# Patient Record
Sex: Female | Born: 1965
Health system: Southern US, Community
[De-identification: ages and names within clinical notes are randomized; demographics above are authoritative.]

## PROBLEM LIST (undated history)

## (undated) DIAGNOSIS — G8929 Other chronic pain: Secondary | ICD-10-CM

## (undated) DIAGNOSIS — G4730 Sleep apnea, unspecified: Secondary | ICD-10-CM

## (undated) DIAGNOSIS — K219 Gastro-esophageal reflux disease without esophagitis: Secondary | ICD-10-CM

## (undated) HISTORY — PX: ABDOMINAL HYSTERECTOMY: SHX81

## (undated) HISTORY — DX: Gastro-esophageal reflux disease without esophagitis: K21.9

## (undated) HISTORY — DX: Other chronic pain: G89.29

## (undated) HISTORY — PX: FRACTURE SURGERY: SHX138

## (undated) HISTORY — PX: FOOT SURGERY: SHX648

## (undated) HISTORY — DX: Sleep apnea, unspecified: G47.30

---

## 2013-01-31 ENCOUNTER — Emergency Department (HOSPITAL_COMMUNITY)
Admission: EM | Admit: 2013-01-31 | Discharge: 2013-01-31 | Disposition: A | Discharge status: Home / Self Care | Payer: BC Managed Care – PPO | Attending: Emergency Medicine | Admitting: Emergency Medicine

## 2013-01-31 ENCOUNTER — Encounter (HOSPITAL_COMMUNITY): Payer: Self-pay | Admitting: Emergency Medicine

## 2013-01-31 ENCOUNTER — Emergency Department (HOSPITAL_COMMUNITY): Payer: BC Managed Care – PPO

## 2013-01-31 DIAGNOSIS — R079 Chest pain, unspecified: Secondary | ICD-10-CM | POA: Insufficient documentation

## 2013-01-31 DIAGNOSIS — R000 Tachycardia, unspecified: Secondary | ICD-10-CM | POA: Insufficient documentation

## 2013-01-31 LAB — URINALYSIS, ROUTINE W REFLEX MICROSCOPIC
Bilirubin Urine: NEGATIVE
Ketones, ur: NEGATIVE mg/dL
Leukocytes, UA: NEGATIVE
Nitrite: NEGATIVE
Protein, ur: NEGATIVE mg/dL
Urobilinogen, UA: 0.2 mg/dL (ref 0.0–1.0)
pH: 6.5 (ref 5.0–8.0)

## 2013-01-31 LAB — COMPREHENSIVE METABOLIC PANEL
AST: 19 U/L (ref 0–37)
Albumin: 4.2 g/dL (ref 3.5–5.2)
Alkaline Phosphatase: 94 U/L (ref 39–117)
BUN: 10 mg/dL (ref 6–23)
CO2: 24 mEq/L (ref 19–32)
Chloride: 96 mEq/L (ref 96–112)
Creatinine, Ser: 0.88 mg/dL (ref 0.50–1.10)
GFR calc Af Amer: 89 mL/min — ABNORMAL LOW (ref >90)
Glucose, Bld: 111 mg/dL — ABNORMAL HIGH (ref 70–99)
Potassium: 3.2 mEq/L — ABNORMAL LOW (ref 3.5–5.1)
Total Bilirubin: 0.3 mg/dL (ref 0.3–1.2)
Total Protein: 7.6 g/dL (ref 6.0–8.3)

## 2013-01-31 LAB — CBC
HCT: 39.1 % (ref 36.0–46.0)
MCH: 30.4 pg (ref 26.0–34.0)
MCHC: 33.2 g/dL (ref 30.0–36.0)
Platelets: 288 10*3/uL (ref 150–400)
RBC: 4.27 MIL/uL (ref 3.87–5.11)
RDW: 12.6 % (ref 11.5–15.5)
WBC: 8.7 10*3/uL (ref 4.0–10.5)

## 2013-01-31 LAB — POCT I-STAT TROPONIN I: Troponin i, poc: 0 ng/mL (ref 0.00–0.08)

## 2013-01-31 LAB — PROTIME-INR
INR: 0.93 (ref 0.00–1.49)
Prothrombin Time: 12.3 seconds (ref 11.6–15.2)

## 2013-01-31 LAB — PRO B NATRIURETIC PEPTIDE: Pro B Natriuretic peptide (BNP): 24.9 pg/mL (ref 0–125)

## 2013-01-31 MED ORDER — ASPIRIN 81 MG PO CHEW
324.0000 mg | CHEWABLE_TABLET | Freq: Once | ORAL | Status: AC
  Administered 2013-01-31: 324 mg via ORAL
  Filled 2013-01-31: qty 4

## 2013-01-31 MED ORDER — IOHEXOL 350 MG/ML SOLN
100.0000 mL | Freq: Once | INTRAVENOUS | Status: AC | PRN
  Administered 2013-01-31: 100 mL via INTRAVENOUS

## 2013-01-31 MED ORDER — SODIUM CHLORIDE 0.9 % IV SOLN
Freq: Once | INTRAVENOUS | Status: AC
  Administered 2013-01-31: 22:00:00 via INTRAVENOUS

## 2013-01-31 NOTE — ED Provider Notes (Signed)
CSN: [id number]     Arrival date & time 01/31/13  1838 History   First MD Initiated Contact with Patient 01/31/13 1846     Chief Complaint  Patient presents with  . Chest Pain   (Consider location/radiation/quality/duration/timing/severity/associated sxs/prior Treatment) HPI Patient presents with chest discomfort. Symptoms began several hours ago, insidiously.  Initially the patient had no discomfort in her neck, then tightness in her intrascapular area, and subsequently developed chest discomfort.  The chest discomfort is focally about the left sternal area, with mild radiation towards his left shoulder.  There is no nausea, no vomiting.  There is mild unsettled sensation, mild dyspnea. No concurrent extremity edema, dysesthesia, any syncope, confusion, disorientation. Patient has no recent travel history, does not smoke, does not drink substantially. No notable family history.  History reviewed. No pertinent past medical history. Past Surgical History  Procedure Laterality Date  . Abdominal hysterectomy     No family history on file. History  Substance Use Topics  . Smoking status: Never Smoker   . Smokeless tobacco: Not on file  . Alcohol Use: No   OB History   Grav Para Term Preterm Abortions TAB SAB Ect Mult Living                 Review of Systems  Constitutional:       Per HPI, otherwise negative  HENT:       Per HPI, otherwise negative  Respiratory:       Per HPI, otherwise negative  Cardiovascular:       Per HPI, otherwise negative  Gastrointestinal: Negative for vomiting.  Endocrine:       Negative aside from HPI  Genitourinary:       Neg aside from HPI   Musculoskeletal:       Per HPI, otherwise negative  Skin: Negative.   Neurological: Negative for syncope.    Allergies  Review of patient's allergies indicates not on file.  Home Medications  No current outpatient prescriptions on file. BP 165/86  Pulse 106  Temp(Src) 97.9 F (36.6 C) (Oral)   Resp 14  SpO2 100% Physical Exam  Nursing note and vitals reviewed. Constitutional: She is oriented to person, place, and time. She appears well-developed and well-nourished. No distress.  HENT:  Head: Normocephalic and atraumatic.  Eyes: Conjunctivae and EOM are normal.  Cardiovascular: Regular rhythm.  Tachycardia present.   Pulses are symmetric in all 4 extremes  Pulmonary/Chest: Effort normal and breath sounds normal. No stridor. No respiratory distress.  Abdominal: She exhibits no distension.  Musculoskeletal: She exhibits no edema.  Neurological: She is alert and oriented to person, place, and time. No cranial nerve deficit.  Skin: Skin is warm and dry.  Psychiatric: She has a normal mood and affect.    ED Course  Procedures (including critical care time) Labs Review Labs Reviewed  PRO B NATRIURETIC PEPTIDE  CBC  COMPREHENSIVE METABOLIC PANEL  PROTIME-INR  D-DIMER, QUANTITATIVE   Imaging Review No results found.  EKG Interpretation     Ventricular Rate:  103 PR Interval:  143 QRS Duration: 103 QT Interval:  341 QTC Calculation: 446 R Axis:   3 Text Interpretation:  Sinus tachycardia Anterior infarct, age indeterminate Baseline wander in lead(s) V1 V2 Long QT Abnormal ekg           Cardiac 110 abnormal Pulse oximetry 100% room air normal  9:19 PM Patient CP free.  She now recalls a prior stress test that was largely unremarkable save  for MVP. We discussed admission - she prefers D/C.  We agreed to perform serial troponins, and reassess MDM   1. Chest pain    this patient presents after day of discomfort, but specifically no pain in her chest, back, with occasional dyspnea.  On initial exam the patient is tachycardic, and given her description of pain in her back, chest, mild dyspnea, PE and aortic dissection were considerations.  The patient improved substantially here, her reassuring labs, x-ray, CT scan.  I advised the patient for overnight  evaluation.,  But she preferred to discharge.  Subsequently we had serial troponins performed, which were negative, reassuring.  I had a lengthy discussion with the patient and family members about the need for ongoing evaluation as an outpatient, and return precautions.     Gerhard Munch, MD 02/01/13 [phone]

## 2013-01-31 NOTE — ED Notes (Signed)
Per pt, felt "off" earlier today-states a few minutes ago felt tightness in back-felt neck tight and jaw pain-discomfort in left chest radiating to left arm-dizzy, no N/V

## 2013-01-31 NOTE — ED Notes (Signed)
MD at bedside speaking to pt and family at this time

## 2013-01-31 NOTE — Progress Notes (Signed)
Patient confirms she doesnot have a pcp.  Patient and her family moved to [facility] from [place] three years ago.  Patient believes her pcp will be at [facility].  She has not made an appointment yet.

## 2013-02-21 ENCOUNTER — Encounter: Payer: Self-pay | Admitting: Family

## 2013-02-21 ENCOUNTER — Ambulatory Visit (INDEPENDENT_AMBULATORY_CARE_PROVIDER_SITE_OTHER): Payer: BC Managed Care – PPO | Admitting: Family

## 2013-02-21 VITALS — BP 120/80 | HR 80 | Ht 66.0 in | Wt 220.0 lb

## 2013-02-21 DIAGNOSIS — K219 Gastro-esophageal reflux disease without esophagitis: Secondary | ICD-10-CM | POA: Insufficient documentation

## 2013-02-21 DIAGNOSIS — Z0000 Encounter for general adult medical examination without abnormal findings: Secondary | ICD-10-CM

## 2013-02-21 DIAGNOSIS — Z1231 Encounter for screening mammogram for malignant neoplasm of breast: Secondary | ICD-10-CM

## 2013-02-21 NOTE — Patient Instructions (Signed)

## 2013-02-21 NOTE — Progress Notes (Signed)
   Subjective:    Patient ID: Carmen Knight, female    DOB: 1965/11/02, 47 y.o.   MRN: [id number]  HPI  47 year old white female, nonsmoker, the patient to the practice and to be established. She was seen in the emergency department on 01/31/2013 after she presented with chest pain and tightness. She had a full cardiac workup to include cardiac enzymes are all negative. She was found to have GERD. Patient reports taking Prilosec x10 days and her symptoms completely resolved. She denies any current chest pain, palpitations, shortness of breath, or edema. Has not seen a primary care provider in approximately 2 years.  Review of Systems  Constitutional: Negative.   HENT: Negative.   Respiratory: Negative.   Cardiovascular: Negative.   Gastrointestinal: Negative.   Endocrine: Negative.   Genitourinary: Negative.   Musculoskeletal: Negative.   Skin: Negative.   Allergic/Immunologic: Negative.   Neurological: Negative.   Hematological: Negative.   Psychiatric/Behavioral: Negative.    No past medical history on file.  History   Social History  . Marital Status: Married    Spouse Name: N/A    Number of Children: N/A  . Years of Education: N/A   Occupational History  . Not on file.   Social History Main Topics  . Smoking status: Never Smoker   . Smokeless tobacco: Not on file  . Alcohol Use: No  . Drug Use: No  . Sexual Activity: Not on file   Other Topics Concern  . Not on file   Social History Narrative  . No narrative on file    Past Surgical History  Procedure Laterality Date  . Abdominal hysterectomy    . Foot surgery      Family History  Problem Relation Age of Onset  . Prostate cancer Father     No Known Allergies  No current outpatient prescriptions on file prior to visit.   No current facility-administered medications on file prior to visit.    BP 120/80  Pulse 80  Ht 5\' 6"  (1.676 m)  Wt 220 lb (99.791 kg)  BMI 35.53 kg/m2chart     Objective:   Physical Exam  Constitutional: She is oriented to person, place, and time. She appears well-developed and well-nourished.  HENT:  Head: Normocephalic.  Right Ear: External ear normal.  Left Ear: External ear normal.  Nose: Nose normal.  Mouth/Throat: Oropharynx is clear and moist.  Neck: Normal range of motion. Neck supple.  Cardiovascular: Normal rate, regular rhythm and normal heart sounds.   Pulmonary/Chest: Effort normal and breath sounds normal.  Abdominal: Bowel sounds are normal.  Musculoskeletal: Normal range of motion.  Neurological: She is alert and oriented to person, place, and time.  Skin: Skin is warm and dry.  Psychiatric: She has a normal mood and affect.          Assessment & Plan:  Assessment: 1. GERD 2. Obesity  Plan: Continue Prilosec OTC as needed. Fasting labs for her physical OB obtained today to include CMP, lipid, CBC, TSH, UA we'll notify patient pending results. Encouraged healthy diet, exercise, self breast exams. Follow up for physical in 3 weeks and sooner as needed.

## 2013-02-21 NOTE — Progress Notes (Signed)
Pre visit review using our clinic review tool, if applicable. No additional management support is needed unless otherwise documented below in the visit note. 

## 2013-04-02 ENCOUNTER — Encounter: Payer: Self-pay | Admitting: Family

## 2013-04-02 ENCOUNTER — Other Ambulatory Visit (HOSPITAL_COMMUNITY)
Admission: RE | Admit: 2013-04-02 | Discharge: 2013-04-02 | Disposition: A | Discharge status: Home / Self Care | Payer: BC Managed Care – PPO | Source: Ambulatory Visit | Attending: Family | Admitting: Family

## 2013-04-02 ENCOUNTER — Ambulatory Visit (INDEPENDENT_AMBULATORY_CARE_PROVIDER_SITE_OTHER): Payer: BC Managed Care – PPO | Admitting: Family

## 2013-04-02 VITALS — BP 120/90 | HR 86 | Ht 66.0 in | Wt 218.0 lb

## 2013-04-02 DIAGNOSIS — E669 Obesity, unspecified: Secondary | ICD-10-CM | POA: Insufficient documentation

## 2013-04-02 DIAGNOSIS — Z0000 Encounter for general adult medical examination without abnormal findings: Secondary | ICD-10-CM

## 2013-04-02 DIAGNOSIS — Z124 Encounter for screening for malignant neoplasm of cervix: Secondary | ICD-10-CM

## 2013-04-02 DIAGNOSIS — Z01419 Encounter for gynecological examination (general) (routine) without abnormal findings: Secondary | ICD-10-CM | POA: Insufficient documentation

## 2013-04-02 DIAGNOSIS — E66812 Obesity, class 2: Secondary | ICD-10-CM | POA: Insufficient documentation

## 2013-04-02 LAB — CBC WITH DIFFERENTIAL/PLATELET
BASOS ABS: 0 10*3/uL (ref 0.0–0.1)
Basophils Relative: 0.5 % (ref 0.0–3.0)
Eosinophils Absolute: 0.2 10*3/uL (ref 0.0–0.7)
Eosinophils Relative: 3.7 % (ref 0.0–5.0)
HEMATOCRIT: 38.1 % (ref 36.0–46.0)
Hemoglobin: 12.8 g/dL (ref 12.0–15.0)
LYMPHS ABS: 1.6 10*3/uL (ref 0.7–4.0)
Lymphocytes Relative: 32.8 % (ref 12.0–46.0)
MCHC: 33.6 g/dL (ref 30.0–36.0)
MCV: 91.5 fl (ref 78.0–100.0)
MONO ABS: 0.4 10*3/uL (ref 0.1–1.0)
Monocytes Relative: 8.4 % (ref 3.0–12.0)
Neutro Abs: 2.6 10*3/uL (ref 1.4–7.7)
Neutrophils Relative %: 54.6 % (ref 43.0–77.0)
Platelets: 308 10*3/uL (ref 150.0–400.0)
RBC: 4.16 Mil/uL (ref 3.87–5.11)
RDW: 13.8 % (ref 11.5–14.6)
WBC: 4.8 10*3/uL (ref 4.5–10.5)

## 2013-04-02 LAB — LIPID PANEL
CHOLESTEROL: 183 mg/dL (ref 0–200)
HDL: 39 mg/dL — AB (ref >39.00)
LDL Cholesterol: 115 mg/dL — ABNORMAL HIGH (ref 0–99)
TRIGLYCERIDES: 145 mg/dL (ref 0.0–149.0)
Total CHOL/HDL Ratio: 5
VLDL: 29 mg/dL (ref 0.0–40.0)

## 2013-04-02 LAB — POCT URINALYSIS DIPSTICK
BILIRUBIN UA: NEGATIVE
Glucose, UA: NEGATIVE
Ketones, UA: NEGATIVE
Leukocytes, UA: NEGATIVE
Nitrite, UA: NEGATIVE
PH UA: 5
Protein, UA: NEGATIVE
RBC UA: NEGATIVE
Spec Grav, UA: >=1.03
UROBILINOGEN UA: 0.2

## 2013-04-02 LAB — COMPREHENSIVE METABOLIC PANEL
ALBUMIN: 4.1 g/dL (ref 3.5–5.2)
ALK PHOS: 73 U/L (ref 39–117)
ALT: 17 U/L (ref 0–35)
AST: 17 U/L (ref 0–37)
BUN: 13 mg/dL (ref 6–23)
CO2: 28 mEq/L (ref 19–32)
Calcium: 9.2 mg/dL (ref 8.4–10.5)
Chloride: 104 mEq/L (ref 96–112)
Creatinine, Ser: 0.8 mg/dL (ref 0.4–1.2)
GFR: 83.65 mL/min (ref >60.00)
GLUCOSE: 104 mg/dL — AB (ref 70–99)
POTASSIUM: 4.1 meq/L (ref 3.5–5.1)
SODIUM: 140 meq/L (ref 135–145)
Total Bilirubin: 0.7 mg/dL (ref 0.3–1.2)
Total Protein: 7.2 g/dL (ref 6.0–8.3)

## 2013-04-02 LAB — TSH: TSH: 3.3 u[IU]/mL (ref 0.35–5.50)

## 2013-04-02 NOTE — Progress Notes (Signed)
   Subjective:    Patient ID: Carmen PancoastKaren Knight, female    DOB: 1965-06-21, 48 y.o.   MRN: [id number]  HPI  48 year old caucasian female, nonsmoker presenting today for an annual complete physical examination.  This is a routine physical examination for this healthy  female. Reviewed all health maintenance protocols including mammogram and reviewed appropriate screening labs. Her immunization history was reviewed.    Review of Systems  Constitutional: Negative.   HENT: Negative.   Eyes: Negative.   Respiratory: Negative.   Cardiovascular: Negative.   Gastrointestinal: Negative.   Endocrine: Negative.   Genitourinary: Negative.   Musculoskeletal: Negative.   Skin: Negative.   Allergic/Immunologic: Negative.   Neurological: Negative.   Hematological: Negative.   Psychiatric/Behavioral: Negative.    No past medical history on file.  History   Social History  . Marital Status: Married    Spouse Name: N/A    Number of Children: N/A  . Years of Education: N/A   Occupational History  . Not on file.   Social History Main Topics  . Smoking status: Never Smoker   . Smokeless tobacco: Not on file  . Alcohol Use: No  . Drug Use: No  . Sexual Activity: Not on file   Other Topics Concern  . Not on file   Social History Narrative  . No narrative on file    Past Surgical History  Procedure Laterality Date  . Abdominal hysterectomy    . Foot surgery      Family History  Problem Relation Age of Onset  . Prostate cancer Father     No Known Allergies  No current outpatient prescriptions on file prior to visit.   No current facility-administered medications on file prior to visit.    BP 120/90  Pulse 86  Ht 5\' 6"  (1.676 m)  Wt 218 lb (98.884 kg)  BMI 35.20 kg/m2chart    Objective:   Physical Exam  Constitutional: She is oriented to person, place, and time. She appears well-developed and well-nourished.  HENT:  Head: Normocephalic and atraumatic.  Right Ear:  External ear normal.  Left Ear: External ear normal.  Eyes: Conjunctivae and EOM are normal. Pupils are equal, round, and reactive to light.  Neck: Normal range of motion. Neck supple.  Cardiovascular: Normal rate, regular rhythm and normal heart sounds.   Pulmonary/Chest: Effort normal and breath sounds normal. Right breast exhibits no inverted nipple, no mass, no nipple discharge, no skin change and no tenderness. Left breast exhibits no inverted nipple, no mass, no nipple discharge, no skin change and no tenderness. Breasts are symmetrical.  Abdominal: Soft. Bowel sounds are normal.  Genitourinary: Vagina normal and uterus normal. Guaiac negative stool. No vaginal discharge found.  Musculoskeletal: Normal range of motion.  Neurological: She is alert and oriented to person, place, and time.  Skin: Skin is warm and dry.  Psychiatric: She has a normal mood and affect.          Assessment & Plan:  Assessment 1. Annual Physical  Examination 2. Obesity  Plan 1. Scheduled for mammogram. 2. Obtain labs that include CMP, Lipid, CBC, TSH, and UA.  3.  Encourage self breast exams monthly. 4.Call office for questions or concerns.

## 2013-04-02 NOTE — Patient Instructions (Signed)

## 2014-07-12 ENCOUNTER — Encounter: Payer: Self-pay | Admitting: Family Medicine

## 2014-07-12 ENCOUNTER — Ambulatory Visit (INDEPENDENT_AMBULATORY_CARE_PROVIDER_SITE_OTHER): Payer: 59 | Admitting: Family Medicine

## 2014-07-12 VITALS — BP 104/70 | HR 84 | Temp 98.7°F | Ht 66.0 in | Wt 221.3 lb

## 2014-07-12 DIAGNOSIS — L853 Xerosis cutis: Secondary | ICD-10-CM | POA: Diagnosis not present

## 2014-07-12 DIAGNOSIS — S39011A Strain of muscle, fascia and tendon of abdomen, initial encounter: Secondary | ICD-10-CM | POA: Diagnosis not present

## 2014-07-12 DIAGNOSIS — R682 Dry mouth, unspecified: Secondary | ICD-10-CM

## 2014-07-12 LAB — TSH: TSH: 3.05 u[IU]/mL (ref 0.35–4.50)

## 2014-07-12 LAB — HEMOGLOBIN A1C: HEMOGLOBIN A1C: 5.7 % (ref 4.6–6.5)

## 2014-07-12 NOTE — Progress Notes (Signed)
  HPI:  Dry mouth/dry skin: -started 2 weeks ago -no vision changes, polyuria, polydipsia, dry eyes, dry noses, sinus issues -she is going through menopause, hot flashes and dry skin -she wonders if is diabetes  R side pain: -plays tennis and thinks strained sometimes -just noticed it this morning when reached down in the shower -denies: fevers, malaise, nausea, diarrhea  ROS: See pertinent positives and negatives per HPI.  No past medical history on file.  Past Surgical History  Procedure Laterality Date  . Abdominal hysterectomy    . Foot surgery      Family History  Problem Relation Age of Onset  . Prostate cancer Father     History   Social History  . Marital Status: Married    Spouse Name: N/A  . Number of Children: N/A  . Years of Education: N/A   Social History Main Topics  . Smoking status: Never Smoker   . Smokeless tobacco: Not on file  . Alcohol Use: No  . Drug Use: No  . Sexual Activity: Not on file   Other Topics Concern  . None   Social History Narrative     Current outpatient prescriptions:  .  fexofenadine (ALLEGRA) 180 MG tablet, Take 180 mg by mouth as needed for allergies or rhinitis., Disp: , Rfl:   EXAM:  Filed Vitals:   07/12/14 1410  BP: 104/70  Pulse: 84  Temp: 98.7 F (37.1 C)    Body mass index is 35.74 kg/(m^2).  GENERAL: vitals reviewed and listed above, alert, oriented, appears well hydrated and in no acute distress  HEENT: atraumatic, conjunttiva clear, no obvious abnormalities on inspection of external nose and ears, normal exam of the oropharynx with normal amounts of saliva and no signs of dry mouth, eyes or nose  NECK: no obvious masses on inspection  LUNGS: clear to auscultation bilaterally, no wheezes, rales or rhonchi, good air movement  CV: HRRR, no peripheral edema  MS: moves all extremities without noticeable abnormality  Minimal TTP abd wal muscles R flank with superficial palpation, not TTP with  deeper structures, no rebound or guarding  PSYCH: pleasant and cooperative, no obvious depression or anxiety  ASSESSMENT AND PLAN:  Discussed the following assessment and plan:  Dry mouth - Plan: Hemoglobin A1c, TSH  Abdominal wall strain, initial encounter  Xerosis of skin  -tests per order for symptoms -lemon drops -follow up with dentist if labs normal and dry mouth continues -for strain heat and OTC analgesics and follow up if persists in 2-3 weeks -Patient advised to return or notify a doctor immediately if symptoms worsen or persist or new concerns arise.  Patient Instructions  BEFORE YOU LEAVE: -labs -schedule a new patient visit in 3 months  We recommend the following healthy lifestyle measures: - eat a healthy diet consisting of lots of vegetables, fruits, beans, nuts, seeds, healthy meats such as white chicken and fish and whole grains.  - avoid fried foods, fast food, processed foods, sodas, red meet and other fattening foods.  - get a least 150 minutes of aerobic exercise per week.       Kriste BasqueKIM, HANNAH R.

## 2014-07-12 NOTE — Patient Instructions (Signed)
BEFORE YOU LEAVE: -labs -schedule a new patient visit in 3 months  We recommend the following healthy lifestyle measures: - eat a healthy diet consisting of lots of vegetables, fruits, beans, nuts, seeds, healthy meats such as white chicken and fish and whole grains.  - avoid fried foods, fast food, processed foods, sodas, red meet and other fattening foods.  - get a least 150 minutes of aerobic exercise per week.

## 2014-07-12 NOTE — Progress Notes (Signed)
Pre visit review using our clinic review tool, if applicable. No additional management support is needed unless otherwise documented below in the visit note. 

## 2014-10-22 ENCOUNTER — Ambulatory Visit: Payer: 59 | Admitting: Family Medicine

## 2015-04-24 ENCOUNTER — Telehealth: Payer: Self-pay | Admitting: Family

## 2015-04-24 NOTE — Telephone Encounter (Signed)
Ok. Please schedule npv. Thanks.

## 2015-04-24 NOTE — Telephone Encounter (Signed)
Pt had an appt with dr Selena Batten on 10-22-14 and cancelled. Pt would like to re-est w/kim. Pt was seeing webb. Can I sch?

## 2015-04-25 ENCOUNTER — Encounter: Payer: Self-pay | Admitting: Family Medicine

## 2015-04-25 ENCOUNTER — Ambulatory Visit (INDEPENDENT_AMBULATORY_CARE_PROVIDER_SITE_OTHER): Payer: 59 | Admitting: Family Medicine

## 2015-04-25 VITALS — BP 126/80 | HR 82 | Temp 98.6°F | Ht 66.0 in | Wt 215.1 lb

## 2015-04-25 DIAGNOSIS — Z7189 Other specified counseling: Secondary | ICD-10-CM | POA: Diagnosis not present

## 2015-04-25 DIAGNOSIS — E669 Obesity, unspecified: Secondary | ICD-10-CM

## 2015-04-25 DIAGNOSIS — G479 Sleep disorder, unspecified: Secondary | ICD-10-CM

## 2015-04-25 DIAGNOSIS — F430 Acute stress reaction: Secondary | ICD-10-CM

## 2015-04-25 DIAGNOSIS — Z113 Encounter for screening for infections with a predominantly sexual mode of transmission: Secondary | ICD-10-CM | POA: Diagnosis not present

## 2015-04-25 DIAGNOSIS — Z7689 Persons encountering health services in other specified circumstances: Secondary | ICD-10-CM

## 2015-04-25 DIAGNOSIS — R0683 Snoring: Secondary | ICD-10-CM

## 2015-04-25 NOTE — Patient Instructions (Signed)
BEFORE YOU LEAVE: -labs -schedule physical in 3-5 months - come fasting and we will plan to do labs that day, drink plenty of water  We recommend the following healthy lifestyle measures: - eat a healthy whole foods diet consisting of regular small meals composed of vegetables, fruits, beans, nuts, seeds, healthy meats such as white chicken and fish and whole grains.  - avoid sweets, white starchy foods, fried foods, fast food, processed foods, sodas, red meet and other fattening foods.  - get a least 150-300 minutes of aerobic exercise per week.   -We have ordered labs or studies at this visit. It can take up to 1-2 weeks for results and processing. We will contact you with instructions IF your results are abnormal. Normal results will be released to your [facility]. If you have not heard from [place] or can not find your results in [facility] in 2 weeks please contact our office.  Call to schedule mammogram  Consider cologuard or colonoscopy and let us know which you would like to do  We placed a referral for you as discussed for evaluation for sleep apnea. It usually takes about 1-2 weeks to process and schedule this referral. If you have not heard from [place] regarding this appointment in 2 weeks please contact our office.

## 2015-04-25 NOTE — Progress Notes (Signed)
Pre visit review using our clinic review tool, if applicable. No additional management support is needed unless otherwise documented below in the visit note. 

## 2015-04-25 NOTE — Telephone Encounter (Signed)
Pt has been sch for today 

## 2015-04-25 NOTE — Progress Notes (Signed)
HPI:  Carmen Knight is a pleasant 50 yo F here to establish care. She used to see Worthy Rancher whom left this practice. She has PMH of allergies and takes allegra and GERD. She is s/p complete abdominal hysterectomy for menorrhagia - no cancer. She has unfortunately suffered mild depression and anxiety related to marital issues since 08/2014. She is getting CBT and is in a christian divorce support group and feels is coping well. Denies SI, or feeling unsafe. Wants STI testing as spouse cheated on her. Denies any symptoms of STIs. She wants testing for OSA as brther and multiple family members have this, she snores, wakes frequently of fit bit app and has unrestful sleep and occ mild daytime somnelence. She She is due for a number of preventive care measures including colon ca screening, mammogram and flu and tetanus vaccines.   ROS negative for unless reported above: fevers, unintentional weight loss, hearing or vision loss, chest pain, palpitations, struggling to breath, hemoptysis, melena, hematochezia, hematuria, falls, loc, si, thoughts of self harm  No past medical history on file.  Past Surgical History  Procedure Laterality Date  . Abdominal hysterectomy    . Foot surgery      Family History  Problem Relation Age of Onset  . Prostate cancer Father     Social History   Social History  . Marital Status: Married    Spouse Name: N/A  . Number of Children: N/A  . Years of Education: N/A   Social History Main Topics  . Smoking status: Never Smoker   . Smokeless tobacco: None  . Alcohol Use: No  . Drug Use: No  . Sexual Activity: Not Asked   Other Topics Concern  . None   Social History Narrative     Current outpatient prescriptions:  .  fexofenadine (ALLEGRA) 180 MG tablet, Take 180 mg by mouth as needed for allergies or rhinitis., Disp: , Rfl:   EXAM:  Filed Vitals:   04/25/15 1320  BP: 126/80  Pulse: 82  Temp: 98.6 F (37 C)    Body mass index is 34.73  kg/(m^2).  GENERAL: vitals reviewed and listed above, alert, oriented, appears well hydrated and in no acute distress  HEENT: atraumatic, conjunttiva clear, no obvious abnormalities on inspection of external nose and ears  NECK: no obvious masses on inspection  LUNGS: clear to auscultation bilaterally, no wheezes, rales or rhonchi, good air movement  CV: HRRR, no peripheral edema  MS: moves all extremities without noticeable abnormality  PSYCH: pleasant and cooperative, no obvious depression or anxiety  ASSESSMENT AND PLAN:  Discussed the following assessment and plan:  Routine screening for STI (sexually transmitted infection) - Plan: HIV antibody (with reflex), RPR  Establishing care with new doctor, encounter for -We reviewed the PMH, PSH, FH, SH, Meds and Allergies. -We provided refills for any medications we will prescribe as needed. -We addressed current concerns per orders and patient instructions. -We have asked for records for pertinent exams, studies, vaccines and notes from previous providers. -We have advised patient to follow up per instructions below.  Acute reaction to stress -supported, counseled, discussed tx options, she opted to cont CBT and support group, follow up if any worsening or new concerns  Obesity, unspecified - Plan: Ambulatory referral to Pulmonology Snoring - Plan: Ambulatory referral to Pulmonology Sleep disturbance - Plan: Ambulatory referral to Pulmonology -discussed testing and tx options -referral for evaluation for OSA -weight loss, healthy diet, regular exercise advised   -Patient advised  to return or notify a doctor immediately if symptoms worsen or persist or new concerns arise.  Patient Instructions  BEFORE YOU LEAVE: -labs -schedule physical in 3-5 months - come fasting and we will plan to do labs that day, drink plenty of water  We recommend the following healthy lifestyle measures: - eat a healthy whole foods diet consisting  of regular small meals composed of vegetables, fruits, beans, nuts, seeds, healthy meats such as white chicken and fish and whole grains.  - avoid sweets, white starchy foods, fried foods, fast food, processed foods, sodas, red meet and other fattening foods.  - get a least 150-300 minutes of aerobic exercise per week.   -We have ordered labs or studies at this visit. It can take up to 1-2 weeks for results and processing. We will contact you with instructions IF your results are abnormal. Normal results will be released to your [facility]. If you have not heard from [place] or can not find your results in [facility] in 2 weeks please contact our office.  Call to schedule mammogram  Consider cologuard or colonoscopy and let us know which you would like to do  We placed a referral for you as discussed for evaluation for sleep apnea. It usually takes about 1-2 weeks to process and schedule this referral. If you have not heard from [place] regarding this appointment in 2 weeks please contact our office.           [name] R.

## 2015-04-26 LAB — HIV ANTIBODY (ROUTINE TESTING W REFLEX): HIV 1&2 Ab, 4th Generation: NONREACTIVE

## 2015-04-26 LAB — RPR

## 2015-05-23 ENCOUNTER — Ambulatory Visit (INDEPENDENT_AMBULATORY_CARE_PROVIDER_SITE_OTHER): Payer: 59 | Admitting: Family Medicine

## 2015-05-23 ENCOUNTER — Encounter: Payer: Self-pay | Admitting: Family Medicine

## 2015-05-23 VITALS — BP 98/70 | HR 79 | Temp 97.6°F | Ht 65.75 in | Wt 212.7 lb

## 2015-05-23 DIAGNOSIS — Z0000 Encounter for general adult medical examination without abnormal findings: Secondary | ICD-10-CM | POA: Diagnosis not present

## 2015-05-23 DIAGNOSIS — B079 Viral wart, unspecified: Secondary | ICD-10-CM

## 2015-05-23 NOTE — Patient Instructions (Signed)
Before you leave: -Schedule a fasting lab visit in the next 4 weeks -Follow up yearly and as needed  Please call today to schedule your mammogram.   Please call your insurance company today to check on the cost of the cologuard, then let us know if you would like to do this  Vitamin D3 [phone]  international units daily.   -We have ordered labs or studies at this visit. It can take up to 1-2 weeks for results and processing. We will contact you with instructions IF your results are abnormal. Normal results will be released to your [facility]. If you have not heard from us or can not find your [facility] in 2 weeks please contact our office.  We recommend the following healthy lifestyle measures: - eat a healthy whole foods diet consisting of regular small meals composed of vegetables, fruits, beans, nuts, seeds, healthy meats such as white chicken and fish and whole grains.  - avoid sweets, white starchy foods, fried foods, fast food, processed foods, sodas, red meet and other fattening foods.  - get a least 150-300 minutes of aerobic exercise per week.

## 2015-05-23 NOTE — Progress Notes (Signed)
Pre visit review using our clinic review tool, if applicable. No additional management support is needed unless otherwise documented below in the visit note. 

## 2015-05-23 NOTE — Progress Notes (Signed)
HPI:   Carmen Knight is a very pleasant 50 year old with past medical history significant for allergies, acid reflux, status post complete abdominal hysterectomy, recent stress (seeing a therapist) and obesity here for a complete physical exam. She reports she is doing quite well. She has started a whole 30s diet and has been happy with recent weight loss. She gets regular aerobic exercise on a daily basis. She has no significant new concerns today. She reports she will be seeing a sleep specialist for sleep apnea test, but that some of her symptoms have improved with weight loss. He has several warts on the leg, and requests liquid nitrogen treatment for these. She reports she has seen a dermatologist in the past for this, but wonders if we could do this here today.  -Diet: variety of foods, balance and well rounded  -Exercise: regular exercise  -Taking folic acid, vitamin D or calcium: no  -Diabetes and Dyslipidemia Screening: not fasting  -Hx of HTN: no  -Vaccines: UTD  -pap history: n/a, complete hysterectomy for heavy bleeding  -FDLMP: n/a  -wants STI testing (Hep C if born [id number]): no  -FH breast, colon or ovarian ca: see FH Last mammogram: Agrees to schedule Last colon cancer screening: Considering cologuard and reports she will call us if she wishes to proceed with this.  Breast Ca Risk Assessment:   -Alcohol, Tobacco, drug use: see social history  Review of Systems - no fevers, unintentional weight loss, vision loss, hearing loss, chest pain, sob, hemoptysis, melena, hematochezia, hematuria, genital discharge, changing or concerning skin lesions, bleeding, bruising, loc, thoughts of self harm or SI.  No past medical history on file.  Past Surgical History  Procedure Laterality Date  . Abdominal hysterectomy    . Foot surgery      Family History  Problem Relation Age of Onset  . Prostate cancer Father     Social History   Social History  . Marital Status: Married     Spouse Name: N/A  . Number of Children: N/A  . Years of Education: N/A   Social History Main Topics  . Smoking status: Never Smoker   . Smokeless tobacco: None  . Alcohol Use: No  . Drug Use: No  . Sexual Activity: Not Asked   Other Topics Concern  . None   Social History Narrative     Current outpatient prescriptions:  .  fexofenadine (ALLEGRA) 180 MG tablet, Take 180 mg by mouth as needed for allergies or rhinitis., Disp: , Rfl:   EXAM:  Filed Vitals:   05/23/15 1519  BP: 98/70  Pulse: 79  Temp: 97.6 F (36.4 C)    GENERAL: vitals reviewed and listed below, alert, oriented, appears well hydrated and in no acute distress  HEENT: head atraumatic, PERRLA, normal appearance of eyes, ears, nose and mouth. moist mucus membranes.  NECK: supple, no masses or lymphadenopathy  LUNGS: clear to auscultation bilaterally, no rales, rhonchi or wheeze  CV: HRRR, no peripheral edema or cyanosis, normal pedal pulses  BREAST: normal appearance - no lesions or discharge, on palpation normal breast tissue without any suspicious masses  ABDOMEN: bowel sounds normal, soft, non tender to palpation, no masses, no rebound or guarding  GU: Declined  SKIN: no rash or abnormal lesions, several SKs on the back, chest and legs. 2 small approximately 3-4 mm in diameter verrucous lesions on the left knee and one on the right lower leg.  MS: normal gait, moves all extremities normally  NEURO: CN II-XII grossly  intact, normal muscle strength and sensation to light touch on extremities  PSYCH: normal affect, pleasant and cooperative  ASSESSMENT AND PLAN:  Discussed the following assessment and plan:  Visit for preventive health examination - Plan: Lipid Panel, Hemoglobin A1c  -Discussed and advised all [place] preventive services health task force level A and B recommendations for age, sex and risks.  -Advised at least 150 minutes of exercise per week and a healthy diet low in saturated  fats and sweets and consisting of fresh fruits and vegetables, lean meats such as fish and white chicken and whole grains.  -labs, studies and vaccines per orders this encounter  Warts -Discussed likely etiologies with wart or atypical appearing SK most likely. She wishes to treat with liquid nitrogen. Risks and alternatives discussed. Time out. Freeze thaw method prior therapy 2 on the left lower extremity and 1 on the right lower extremity. Care instructions and return precautions discussed. Follow-up in 1 month with repeat treatment if desired. Patient tolerated well.   Orders Placed This Encounter  Procedures  . Lipid Panel  . Hemoglobin A1c    Patient advised to return to clinic immediately if symptoms worsen or persist or new concerns.  Patient Instructions  Before you leave: -Schedule a fasting lab visit in the next 4 weeks -Follow up yearly and as needed  Please call today to schedule your mammogram.   Please call your insurance company today to check on the cost of the cologuard, then let us know if you would like to do this  Vitamin D3 [phone]  international units daily.   -We have ordered labs or studies at this visit. It can take up to 1-2 weeks for results and processing. We will contact you with instructions IF your results are abnormal. Normal results will be released to your [facility]. If you have not heard from [place] or can not find your results in [facility] in 2 weeks please contact our office.  We recommend the following healthy lifestyle measures: - eat a healthy whole foods diet consisting of regular small meals composed of vegetables, fruits, beans, nuts, seeds, healthy meats such as white chicken and fish and whole grains.  - avoid sweets, white starchy foods, fried foods, fast food, processed foods, sodas, red meet and other fattening foods.  - get a least 150-300 minutes of aerobic exercise per week.           No Follow-up on file.  [name]  R.

## 2015-06-02 ENCOUNTER — Other Ambulatory Visit: Payer: Self-pay

## 2015-06-02 DIAGNOSIS — Z1231 Encounter for screening mammogram for malignant neoplasm of breast: Secondary | ICD-10-CM

## 2015-06-04 ENCOUNTER — Institutional Professional Consult (permissible substitution): Payer: 59 | Admitting: Pulmonary Disease

## 2015-06-19 ENCOUNTER — Ambulatory Visit
Admission: RE | Admit: 2015-06-19 | Discharge: 2015-06-19 | Disposition: A | Discharge status: Home / Self Care | Payer: 59 | Source: Ambulatory Visit

## 2015-06-19 DIAGNOSIS — Z1231 Encounter for screening mammogram for malignant neoplasm of breast: Secondary | ICD-10-CM

## 2015-06-20 ENCOUNTER — Other Ambulatory Visit: Payer: 59

## 2015-06-24 ENCOUNTER — Other Ambulatory Visit: Payer: Self-pay | Admitting: Family Medicine

## 2015-06-24 DIAGNOSIS — R928 Other abnormal and inconclusive findings on diagnostic imaging of breast: Secondary | ICD-10-CM

## 2015-07-02 ENCOUNTER — Ambulatory Visit
Admission: RE | Admit: 2015-07-02 | Discharge: 2015-07-02 | Disposition: A | Discharge status: Home / Self Care | Payer: 59 | Source: Ambulatory Visit | Attending: Family Medicine | Admitting: Family Medicine

## 2015-07-02 ENCOUNTER — Encounter: Payer: Self-pay | Admitting: Internal Medicine

## 2015-07-02 ENCOUNTER — Ambulatory Visit (INDEPENDENT_AMBULATORY_CARE_PROVIDER_SITE_OTHER): Payer: 59 | Admitting: Internal Medicine

## 2015-07-02 VITALS — BP 130/84 | Temp 98.8°F | Wt 214.0 lb

## 2015-07-02 DIAGNOSIS — L089 Local infection of the skin and subcutaneous tissue, unspecified: Secondary | ICD-10-CM

## 2015-07-02 DIAGNOSIS — R21 Rash and other nonspecific skin eruption: Secondary | ICD-10-CM | POA: Diagnosis not present

## 2015-07-02 DIAGNOSIS — R928 Other abnormal and inconclusive findings on diagnostic imaging of breast: Secondary | ICD-10-CM

## 2015-07-02 DIAGNOSIS — B9689 Other specified bacterial agents as the cause of diseases classified elsewhere: Secondary | ICD-10-CM

## 2015-07-02 DIAGNOSIS — A499 Bacterial infection, unspecified: Secondary | ICD-10-CM | POA: Diagnosis not present

## 2015-07-02 MED ORDER — CEPHALEXIN 500 MG PO CAPS: 500.0000 mg | ORAL_CAPSULE | Freq: Three times a day (TID) | ORAL | Status: DC

## 2015-07-02 NOTE — Patient Instructions (Signed)
Seems to be  From irritation and poss shaving with a  Bit of folliculitis  But  No evidence of abscess .  Add oral antibiotic and avoid shaving over the area stay in dry clothes.   Recheck if fever    persistent or progressive

## 2015-07-02 NOTE — Progress Notes (Signed)
  Pre visit review using our clinic review tool, if applicable. No additional management support is needed unless otherwise documented below in the visit note.  Chief Complaint  Patient presents with  . Rash    HPI: Carmen Knight 50 y.o.  Patient Carmen Knight  comes in today for SDA for  new problem evaluation. PCPNA  ? Cellulitis  Since sun night 3 days ago aftert tennis and runningg.   itcing in pubic area.   And became red and sore  Last night felt hot  And p[layed tennis still antibiotic ointment slightly better   No other sx no hx of same no fever  Pustules hx of hsv  Adenopathy   Going to beach this week .  No vag uti sx  Separated from husband this year ROS: See pertinent positives and negatives per HPI.  No past medical history on file.  Family History  Problem Relation Age of Onset  . Prostate cancer Father     Social History   Social History  . Marital Status: Married    Spouse Name: N/A  . Number of Children: N/A  . Years of Education: N/A   Social History Main Topics  . Smoking status: Never Smoker   . Smokeless tobacco: None  . Alcohol Use: No  . Drug Use: No  . Sexual Activity: Not Asked   Other Topics Concern  . None   Social History Narrative    Outpatient Prescriptions Prior to Visit  Medication Sig Dispense Refill  . fexofenadine (ALLEGRA) 180 MG tablet Take 180 mg by mouth as needed for allergies or rhinitis. Reported on 07/02/2015     No facility-administered medications prior to visit.     EXAM:  BP 130/84 mmHg  Temp(Src) 98.8 F (37.1 C) (Oral)  Wt 214 lb (97.07 kg)  Body mass index is 34.81 kg/(m^2).  GENERAL: vitals reviewed and listed above, alert, oriented, appears well hydrated and in no acute distress HEENT: atraumatic, conjunctiva  clear, no obvious abnormalities on inspection of external nose and ears   Skin in gu area   A patch of flat indurated erythema about 3 cm with central ? folliclualr lesions  No adenopathy streaking or   Fluctuance   Shaved area PSYCH: pleasant and cooperative, no obvious depression or anxiety  ASSESSMENT AND PLAN:  Discussed the following assessment and plan:  Rash and nonspecific skin eruption - curious  patch of rash no shaving  local care antibiotic  doesn look like  yeast at this time  Localized bacterial skin infection ? - folliculitis plus  uncertain cause   empriric therapy    -Patient advised to return or notify health care team  if symptoms worsen ,persist or new concerns arise.  Patient Instructions  Seems to be  From irritation and poss shaving with a  Bit of folliculitis  But  No evidence of abscess .  Add oral antibiotic and avoid shaving over the area stay in dry clothes.   Recheck if fever    persistent or progressive      Burna MortimerWanda K. Charm Stenner M.D.

## 2015-07-21 ENCOUNTER — Encounter: Payer: Self-pay | Admitting: Pulmonary Disease

## 2015-07-21 ENCOUNTER — Ambulatory Visit (INDEPENDENT_AMBULATORY_CARE_PROVIDER_SITE_OTHER): Payer: 59 | Admitting: Pulmonary Disease

## 2015-07-21 VITALS — BP 122/82 | HR 93 | Ht 65.75 in | Wt 209.0 lb

## 2015-07-21 DIAGNOSIS — R05 Cough: Secondary | ICD-10-CM

## 2015-07-21 DIAGNOSIS — K219 Gastro-esophageal reflux disease without esophagitis: Secondary | ICD-10-CM

## 2015-07-21 DIAGNOSIS — R059 Cough, unspecified: Secondary | ICD-10-CM | POA: Insufficient documentation

## 2015-07-21 DIAGNOSIS — G4710 Hypersomnia, unspecified: Secondary | ICD-10-CM | POA: Insufficient documentation

## 2015-07-21 NOTE — Assessment & Plan Note (Signed)
Has allergic UACS. On prn allergy meds. May need flonase if worse.  

## 2015-07-21 NOTE — Patient Instructions (Signed)
1. We will order you a home sleep study. 2. We will give you a call after the study is read. 3. We will order you an auto CPAP 5-15 cm water if the study is positive. Let us know if he have any issues.  Return to clinic in 2-3 mos.

## 2015-07-21 NOTE — Progress Notes (Signed)
Subjective:    Patient ID: Carmen Knight, female    DOB: 31-Aug-1965, 50 y.o.   MRN: [id number]  HPI  This is the case of Carmen Knight, 50 y.o. Female, who was referred by Dr. Kriste BasqueHannah Kim  in consultation regarding possible OSA.   As you very well know, patient is a non smoker.  She does not have any issues with lung problems.  Every now and then, has asthmatic bronchitis.   Patient has hypersomnia. Has snoring, witnessed apneas, gasping, choking. Sometimes with work,  she has to drive to [place] and she gets sleepy driving. Hypersomnia affects functionality. Has dry mouth. Has frequent awakenings. Wakes up unrefreshed in the morning. She has 3 siblings who all have sleep apnea. She looks like them.  ESS 14       Review of Systems  Constitutional: Negative.  Negative for fever and unexpected weight change.  HENT: Positive for congestion, ear pain, nosebleeds, sinus pressure and sore throat. Negative for dental problem, postnasal drip, rhinorrhea, sneezing and trouble swallowing.   Eyes: Negative.  Negative for redness and itching.  Respiratory: Positive for cough. Negative for chest tightness, shortness of breath and wheezing.   Cardiovascular: Negative.  Negative for palpitations and leg swelling.  Gastrointestinal: Negative.  Negative for nausea and vomiting.  Endocrine: Negative.   Genitourinary: Negative.  Negative for dysuria.  Musculoskeletal: Negative.  Negative for joint swelling.  Skin: Negative.  Negative for rash.  Allergic/Immunologic: Positive for environmental allergies.  Neurological: Positive for headaches.  Hematological: Negative.  Does not bruise/bleed easily.  Psychiatric/Behavioral: Negative.  Negative for dysphoric mood. The patient is not nervous/anxious.    No past medical history on file.  (-) CA, DVT, CAD, DM  Family History  Problem Relation Age of Onset  . Prostate cancer Father    Has 3 siblings with OSA.   Past Surgical History  Procedure  Laterality Date  . Abdominal hysterectomy    . Foot surgery      Social History   Social History  . Marital Status: Married    Spouse Name: N/A  . Number of Children: N/A  . Years of Education: N/A   Occupational History  . Not on file.   Social History Main Topics  . Smoking status: Never Smoker   . Smokeless tobacco: Not on file  . Alcohol Use: No  . Drug Use: No  . Sexual Activity: Not on file   Other Topics Concern  . Not on file   Social History Narrative   Separated with 2 children. Works as an Psychologist, educationalexecutive. Occasional ETOH. (-) smoking.   No Known Allergies   Outpatient Prescriptions Prior to Visit  Medication Sig Dispense Refill  . fexofenadine (ALLEGRA) 180 MG tablet Take 180 mg by mouth as needed for allergies or rhinitis. Reported on 07/02/2015    . cephALEXin (KEFLEX) 500 MG capsule Take 1 capsule (500 mg total) by mouth 3 (three) times daily. 15 capsule 0   No facility-administered medications prior to visit.   Meds ordered this encounter  Medications  . amoxicillin (AMOXIL) 875 MG tablet    Sig: Take 875 mg by mouth 2 (two) times daily. for 10 days    Refill:  0          Objective:   Physical Exam  Vitals:  Filed Vitals:   07/21/15 1037  BP: 122/82  Pulse: 93  Height: 5' 5.75" (1.67 m)  Weight: 209 lb (94.802 kg)  SpO2: 96%  Constitutional/General:  Pleasant, well-nourished, well-developed, not in any distress,  Comfortably seating.  Well kempt  Body mass index is 33.99 kg/(m^2). Wt Readings from Last 3 Encounters:  07/21/15 209 lb (94.802 kg)  07/02/15 214 lb (97.07 kg)  05/23/15 212 lb 11.2 oz (96.48 kg)    Neck circumference: 14 in HEENT: Pupils equal and reactive to light and accommodation. Anicteric sclerae. Normal nasal mucosa.   No oral  lesions,  mouth clear,  oropharynx clear, no postnasal drip. (-) Oral thrush. No dental caries.  Airway - Mallampati class III  Neck: No masses. Midline trachea. No JVD, (-) LAD. (-)  bruits appreciated.  Respiratory/Chest: Grossly normal chest. (-) deformity. (-) Accessory muscle use.  Symmetric expansion. (-) Tenderness on palpation.  Resonant on percussion.  Diminished BS on both lower lung zones. (-) wheezing, crackles, rhonchi (-) egophony  Cardiovascular: Regular rate and  rhythm, heart sounds normal, no murmur or gallops, no peripheral edema  Gastrointestinal:  Normal bowel sounds. Soft, non-tender. No hepatosplenomegaly.  (-) masses.   Musculoskeletal:  Normal muscle tone. Normal gait.   Extremities: Grossly normal. (-) clubbing, cyanosis.  (-) edema  Skin: (-) rash,lesions seen.   Neurological/Psychiatric : alert, oriented to time, place, person. Normal mood and affect           Assessment & Plan:  Hypersomnia Patient has hypersomnia. Has snoring, witnessed apneas, gasping, choking. Sometimes with work,  she has to drive to [place] and she gets sleepy driving. Hypersomnia affects functionality. Has dry mouth. Has frequent awakenings. Wakes up unrefreshed in the morning. She has 3 siblings who all have sleep apnea. She looks like them. ESS 14. Neck circ 14 incehs.   Plan: 1. Likely has severe sleep apnea. Plan for HST.  2. If positive, we'll order auto CPAP 5-15 cm water. May need BiPAP. She has a mouth guard OTC.  3. Advised on sleep hygiene.   GERD (gastroesophageal reflux disease) Stable off meds.   Cough Has allergic UACS. On prn allergy meds. May need flonase if worse.     Thank you very much for letting me participate in this patient's care. Please do not hesitate to give me a call if you have any questions or concerns regarding the treatment plan.   Patient will follow up with me in 2-3 mos.     Pollie Meyer, MD 07/21/2015   11:07 AM Pulmonary and Critical Care Medicine [facility] Pager: [phone] Office: [phone], Fax: [phone]

## 2015-07-21 NOTE — Assessment & Plan Note (Signed)
Stable off meds. ?

## 2015-07-21 NOTE — Assessment & Plan Note (Signed)
Patient has hypersomnia. Has snoring, witnessed apneas, gasping, choking. Sometimes with work,  she has to drive to [place] and she gets sleepy driving. Hypersomnia affects functionality. Has dry mouth. Has frequent awakenings. Wakes up unrefreshed in the morning. She has 3 siblings who all have sleep apnea. She looks like them. ESS 14. Neck circ 14 incehs.   Plan: 1. Likely has severe sleep apnea. Plan for HST.  2. If positive, we'll order auto CPAP 5-15 cm water. May need BiPAP. She has a mouth guard OTC.  3. Advised on sleep hygiene.

## 2015-08-05 DIAGNOSIS — G4733 Obstructive sleep apnea (adult) (pediatric): Secondary | ICD-10-CM | POA: Diagnosis not present

## 2015-08-07 ENCOUNTER — Telehealth: Payer: Self-pay | Admitting: Pulmonary Disease

## 2015-08-07 DIAGNOSIS — G4733 Obstructive sleep apnea (adult) (pediatric): Secondary | ICD-10-CM

## 2015-08-07 NOTE — Telephone Encounter (Signed)
  Please call the pt and tell the pt the HOME SLEEP STUDY  showed OSA - mild to moderate  Pt stops breathing  14  times an hour.   Please order autoCPAP 5-15 cm H2O. Patient will need a mask fitting session. Patient will need a 1 month download.   Patient needs to be seen by me or any of the NPs/APPs  4-6 weeks after obtaining the cpap machine. Let me know if you receive this.   Thanks!   J. Alexis FrockAngelo A de Dios, MD 08/07/2015, 4:31 PM

## 2015-08-08 ENCOUNTER — Other Ambulatory Visit: Payer: Self-pay | Admitting: *Deleted

## 2015-08-08 DIAGNOSIS — G4733 Obstructive sleep apnea (adult) (pediatric): Secondary | ICD-10-CM | POA: Diagnosis not present

## 2015-08-08 DIAGNOSIS — G4710 Hypersomnia, unspecified: Secondary | ICD-10-CM

## 2015-08-11 NOTE — Telephone Encounter (Signed)
Attempted to contact pt. Line was busy. Will try back. 

## 2015-08-11 NOTE — Telephone Encounter (Signed)
Pt returning call for results can be reached @ [phone].Caren GriffinsStanley A Dalton

## 2015-08-11 NOTE — Telephone Encounter (Signed)
LMTCB

## 2015-08-14 NOTE — Telephone Encounter (Signed)
Pt is aware of results. Order has been placed for CPAP. Pt will call to make appointment once she is set up. Nothing further was needed at this time.

## 2015-09-08 ENCOUNTER — Telehealth: Payer: Self-pay | Admitting: Pulmonary Disease

## 2015-09-08 NOTE — Telephone Encounter (Signed)
Pt just wanted to know who the order for her cpap went to.  She just received approval from ins co last week.  I told her [facility] & gave her their phone #.  Nothing else needed.

## 2015-10-23 ENCOUNTER — Encounter: Payer: Self-pay | Admitting: Pulmonary Disease

## 2015-10-23 ENCOUNTER — Ambulatory Visit (INDEPENDENT_AMBULATORY_CARE_PROVIDER_SITE_OTHER): Payer: 59 | Admitting: Pulmonary Disease

## 2015-10-23 VITALS — BP 124/78 | HR 78 | Ht 65.76 in | Wt 211.0 lb

## 2015-10-23 DIAGNOSIS — R05 Cough: Secondary | ICD-10-CM

## 2015-10-23 DIAGNOSIS — G4733 Obstructive sleep apnea (adult) (pediatric): Secondary | ICD-10-CM | POA: Diagnosis not present

## 2015-10-23 DIAGNOSIS — K219 Gastro-esophageal reflux disease without esophagitis: Secondary | ICD-10-CM | POA: Diagnosis not present

## 2015-10-23 DIAGNOSIS — R059 Cough, unspecified: Secondary | ICD-10-CM

## 2015-10-23 NOTE — Assessment & Plan Note (Signed)
Pt with hypersomnia, snoring fatigue. HST (08/2015) AHI 14. On autocpap 5-15 cm water.  She feels better over all using it. More energy. Less sleepiness. Feels benefit of cpap.  She has bloating almost daily whe she wakes up. Denies GERD sx.  DL for 1 month 29%, AHI 1.5.   Plan : We extensively discussed the importance of treating OSA and the need to use PAP therapy.   Continue with autocpap BUT will DECREASE pressure to 5-10 cm water to help with bloating/aerophagia. She has a FFM. Can consider switching to nasal mask with chin strap if bloating persists. Also, may try GERD meds and diet changes (less carbs) if bloating persists.  Pt will call if with issues despite cutting down the pressure.    Patient was instructed to have mask, tubings, filter, reservoir cleaned at least once a week with soapy water.  Patient was instructed to call the office if he/she is having issues with the PAP device.    I advised patient to obtain sufficient amount of sleep --  7 to 8 hours at least in a 24 hr period.  Patient was advised to follow good sleep hygiene.  Patient was advised NOT to engage in activities requiring concentration and/or vigilance if he/she is and  sleepy.  Patient is NOT to drive if he/she is sleepy.

## 2015-10-23 NOTE — Patient Instructions (Signed)
  It was a pleasure taking care of you today!  Continue using your CPAP machine. We will decrease your pressure to CPAP 5-10 cm water.   Please make sure you use your CPAP device everytime you sleep.  We will monitor the usage of your machine per your insurance requirement.  Your insurance company may take the machine from you if you are not using it regularly.   Please clean the mask, tubings, filter, water reservoir with soapy water every week.  Please use distilled water for the water reservoir.   Please call the office or your machine provider (DME company) if you are having issues with the device.   Return to clinic in 6 weeks with Dr. Christene Slates or NP.

## 2015-10-23 NOTE — Progress Notes (Signed)
Subjective:    Patient ID: Carmen Knight, female    DOB: [birth year], 50 y.o.   MRN: [id number]         This is the case of Carmen Knight, 50 y.o. Female, who was referred by Dr. [name]  in consultation regarding possible OSA.   As you very well know, patient is a non smoker.  She does not have any issues with lung problems.  Every now and then, has asthmatic bronchitis.   Patient has hypersomnia. Has snoring, witnessed apneas, gasping, choking. Sometimes with work,  she has to drive to [place] and she gets sleepy driving. Hypersomnia affects functionality. Has dry mouth. Has frequent awakenings. Wakes up unrefreshed in the morning. She has 3 siblings who all have sleep apnea. She looks like them.  ESS 14  ROV 10/23/15 Patient returns to office as follow-up on her sleep apnea. Since last seen, she had a home sleep study which showed an AHI of 14. She was started on auto CPAP. Feels better using it although she has bloating when she wakes up in the morning. She feels pressure is too much.  She was placed on auto CPAP 5-15 cm water, download the last month 47% with AHI of 1.5.     Review of Systems  Constitutional: Negative.  Negative for fever and unexpected weight change.  HENT: Positive for congestion, ear pain, nosebleeds, sinus pressure and sore throat. Negative for dental problem, postnasal drip, rhinorrhea, sneezing and trouble swallowing.   Eyes: Negative.  Negative for redness and itching.  Respiratory: Positive for cough. Negative for chest tightness, shortness of breath and wheezing.   Cardiovascular: Negative.  Negative for palpitations and leg swelling.  Gastrointestinal: Negative.  Negative for nausea and vomiting.  Endocrine: Negative.   Genitourinary: Negative.  Negative for dysuria.  Musculoskeletal: Negative.  Negative for joint swelling.  Skin: Negative.  Negative for rash.  Allergic/Immunologic: Positive for environmental allergies.  Neurological: Positive for  headaches.  Hematological: Negative.  Does not bruise/bleed easily.  Psychiatric/Behavioral: Negative.  Negative for dysphoric mood. The patient is not nervous/anxious.    No past medical history on file.  (-) CA, DVT, CAD, DM  Family History  Problem Relation Age of Onset  . Prostate cancer Father    Has 3 siblings with OSA.   Past Surgical History:  Procedure Laterality Date  . ABDOMINAL HYSTERECTOMY    . FOOT SURGERY      Social History   Social History  . Marital status: Married    Spouse name: N/A  . Number of children: N/A  . Years of education: N/A   Occupational History  . Not on file.   Social History Main Topics  . Smoking status: Never Smoker  . Smokeless tobacco: Not on file  . Alcohol use No  . Drug use: No  . Sexual activity: Not on file   Other Topics Concern  . Not on file   Social History Narrative  . No narrative on file   Separated with 2 children. Works as an Psychologist, educational. Occasional ETOH. (-) smoking.   No Known Allergies   Outpatient Medications Prior to Visit  Medication Sig Dispense Refill  . fexofenadine (ALLEGRA) 180 MG tablet Take 180 mg by mouth as needed for allergies or rhinitis. Reported on 07/02/2015    . amoxicillin (AMOXIL) 875 MG tablet Take 875 mg by mouth 2 (two) times daily. for 10 days  0   No facility-administered medications prior to visit.  No orders of the defined types were placed in this encounter.         Objective:   Physical Exam  Vitals:  Vitals:   10/23/15 1614  BP: 124/78  Pulse: 78  SpO2: 96%  Weight: 211 lb (95.7 kg)  Height: 5' 5.76" (1.67 m)    Constitutional/General:  Pleasant, well-nourished, well-developed, not in any distress,  Comfortably seating.  Well kempt  Body mass index is 34.31 kg/m. Wt Readings from Last 3 Encounters:  10/23/15 211 lb (95.7 kg)  07/21/15 209 lb (94.8 kg)  07/02/15 214 lb (97.1 kg)    Neck circumference: 14 in HEENT: Pupils equal and reactive to light  and accommodation. Anicteric sclerae. Normal nasal mucosa.   No oral  lesions,  mouth clear,  oropharynx clear, no postnasal drip. (-) Oral thrush. No dental caries.  Airway - Mallampati class III  Neck: No masses. Midline trachea. No JVD, (-) LAD. (-) bruits appreciated.  Respiratory/Chest: Grossly normal chest. (-) deformity. (-) Accessory muscle use.  Symmetric expansion. (-) Tenderness on palpation.  Resonant on percussion.  Diminished BS on both lower lung zones. (-) wheezing, crackles, rhonchi (-) egophony  Cardiovascular: Regular rate and  rhythm, heart sounds normal, no murmur or gallops, no peripheral edema  Gastrointestinal:  Normal bowel sounds. Soft, non-tender. No hepatosplenomegaly.  (-) masses.   Musculoskeletal:  Normal muscle tone. Normal gait.   Extremities: Grossly normal. (-) clubbing, cyanosis.  (-) edema  Skin: (-) rash,lesions seen.   Neurological/Psychiatric : alert, oriented to time, place, person. Normal mood and affect           Assessment & Plan:  OSA (obstructive sleep apnea) Pt with hypersomnia, snoring fatigue. HST (08/2015) AHI 14. On autocpap 5-15 cm water.  She feels better over all using it. More energy. Less sleepiness. Feels benefit of cpap.  She has bloating almost daily whe she wakes up. Denies GERD sx.  DL for 1 month 46%, AHI 1.5.   Plan : We extensively discussed the importance of treating OSA and the need to use PAP therapy.   Continue with autocpap BUT will DECREASE pressure to 5-10 cm water to help with bloating/aerophagia. She has a FFM. Can consider switching to nasal mask with chin strap if bloating persists. Also, may try GERD meds and diet changes (less carbs) if bloating persists.  Pt will call if with issues despite cutting down the pressure.    Patient was instructed to have mask, tubings, filter, reservoir cleaned at least once a week with soapy water.  Patient was instructed to call the office if he/she is having  issues with the PAP device.    I advised patient to obtain sufficient amount of sleep --  7 to 8 hours at least in a 24 hr period.  Patient was advised to follow good sleep hygiene.  Patient was advised NOT to engage in activities requiring concentration and/or vigilance if he/she is and  sleepy.  Patient is NOT to drive if he/she is sleepy.    GERD (gastroesophageal reflux disease) Recent bloating. May need to start h2 blocker or PPI on f/u.   Cough Has allergic UACS. On prn allergy meds. May need flonase if worse.    Patient will follow up with me in 2-3 mos.     Pollie Meyer, MD 10/23/2015   8:53 PM Pulmonary and Critical Care Medicine [facility] Pager: [phone] Office: [phone], Fax: [phone]

## 2015-10-23 NOTE — Assessment & Plan Note (Signed)
Has allergic UACS. On prn allergy meds. May need flonase if worse.

## 2015-10-23 NOTE — Assessment & Plan Note (Signed)
Recent bloating. May need to start h2 blocker or PPI on f/u.

## 2015-10-30 ENCOUNTER — Telehealth: Payer: Self-pay | Admitting: Pulmonary Disease

## 2015-10-30 NOTE — Telephone Encounter (Signed)
Noted  

## 2015-10-30 NOTE — Telephone Encounter (Signed)
Patient just called back and states as soon as she hung up with me that [facility] called her and they found the order.  Nothing further is needed.

## 2015-11-07 ENCOUNTER — Encounter: Payer: Self-pay | Admitting: Pulmonary Disease

## 2015-12-04 ENCOUNTER — Encounter: Payer: Self-pay | Admitting: Family Medicine

## 2015-12-04 ENCOUNTER — Ambulatory Visit (INDEPENDENT_AMBULATORY_CARE_PROVIDER_SITE_OTHER): Payer: 59 | Admitting: Family Medicine

## 2015-12-04 VITALS — BP 102/70 | HR 86 | Temp 98.5°F | Ht 65.76 in

## 2015-12-04 DIAGNOSIS — Z634 Disappearance and death of family member: Secondary | ICD-10-CM | POA: Diagnosis not present

## 2015-12-04 DIAGNOSIS — Z1211 Encounter for screening for malignant neoplasm of colon: Secondary | ICD-10-CM | POA: Diagnosis not present

## 2015-12-04 DIAGNOSIS — M546 Pain in thoracic spine: Secondary | ICD-10-CM | POA: Diagnosis not present

## 2015-12-04 DIAGNOSIS — R1011 Right upper quadrant pain: Secondary | ICD-10-CM | POA: Diagnosis not present

## 2015-12-04 LAB — COMPREHENSIVE METABOLIC PANEL
ALK PHOS: 83 U/L (ref 39–117)
ALT: 14 U/L (ref 0–35)
AST: 13 U/L (ref 0–37)
Albumin: 4.2 g/dL (ref 3.5–5.2)
BILIRUBIN TOTAL: 0.5 mg/dL (ref 0.2–1.2)
BUN: 14 mg/dL (ref 6–23)
CALCIUM: 9.1 mg/dL (ref 8.4–10.5)
CO2: 31 mEq/L (ref 19–32)
Chloride: 103 mEq/L (ref 96–112)
Creatinine, Ser: 0.73 mg/dL (ref 0.40–1.20)
GFR: 89.32 mL/min (ref >60.00)
Glucose, Bld: 95 mg/dL (ref 70–99)
POTASSIUM: 3.9 meq/L (ref 3.5–5.1)
Sodium: 140 mEq/L (ref 135–145)
TOTAL PROTEIN: 6.6 g/dL (ref 6.0–8.3)

## 2015-12-04 LAB — CBC
HEMATOCRIT: 37 % (ref 36.0–46.0)
HEMOGLOBIN: 12.5 g/dL (ref 12.0–15.0)
MCHC: 33.8 g/dL (ref 30.0–36.0)
MCV: 92.7 fl (ref 78.0–100.0)
PLATELETS: 238 10*3/uL (ref 150.0–400.0)
RBC: 3.99 Mil/uL (ref 3.87–5.11)
RDW: 13.1 % (ref 11.5–15.5)
WBC: 4.3 10*3/uL (ref 4.0–10.5)

## 2015-12-04 NOTE — Progress Notes (Signed)
HPI:  Acute visit for:  RUQ pain and R mid back pain: -started a few weeks ago -unfortunately mother dx with cancer then passed, primary unknown, she was visiting her the last few weeks before she passed, coping ok but was sudden -dull pressure sensation at times in RUQ and occ mild TTP R upper thoracic back muscles - better with stretching; nothing makes it worse -occ gerd - takes OTC med -denies: fevers, chills, malaise, weight loss, nausea, vomiting, diarrhea, change in bowels, constipation, melena, hematochzia  ROS: See pertinent positives and negatives per HPI.  No past medical history on file.  Past Surgical History:  Procedure Laterality Date  . ABDOMINAL HYSTERECTOMY    . FOOT SURGERY      Family History  Problem Relation Age of Onset  . Prostate cancer Father     Social History   Social History  . Marital status: Married    Spouse name: N/A  . Number of children: N/A  . Years of education: N/A   Social History Main Topics  . Smoking status: Never Smoker  . Smokeless tobacco: None  . Alcohol use No  . Drug use: No  . Sexual activity: Not Asked   Other Topics Concern  . None   Social History Narrative  . None     Current Outpatient Prescriptions:  .  fexofenadine (ALLEGRA) 180 MG tablet, Take 180 mg by mouth as needed for allergies or rhinitis. Reported on 07/02/2015, Disp: , Rfl:   EXAM:  Vitals:   12/04/15 0902  BP: 102/70  Pulse: 86  Temp: 98.5 F (36.9 C)    There is no height or weight on file to calculate BMI.  GENERAL: vitals reviewed and listed above, alert, oriented, appears well hydrated and in no acute distress  HEENT: atraumatic, conjunttiva clear, no obvious abnormalities on inspection of external nose and ears  NECK: no obvious masses on inspection  LUNGS: clear to auscultation bilaterally, no wheezes, rales or rhonchi, good air movement  CV: HRRR, no peripheral edema  ABD: BS+, soft, mild TTP RUQ, no rebound or  guarding  MS: moves all extremities without noticeable abnormality; mild TTP upper lumbar, lower thoracic paraspinal lumbar muscles  PSYCH: pleasant and cooperative, no obvious depression or anxiety  ASSESSMENT AND PLAN:  Discussed the following assessment and plan:  RUQ pain - Plan: CMP with eGFR, CBC (no diff), US Abdomen Limited RUQ, Ambulatory referral to Gastroenterology  Right-sided thoracic back pain  Colon cancer screening  -query muscular from travel/etc - advise stretching and heat -she wants to do a colonoscopy for colon ca screening - order placed -pt rightfully with heightened concern given recent events with her mother -labs, RUQ [place] to exclude other with close follow up -offered help and local resources for bereavement -Patient advised to return or notify a doctor immediately if symptoms worsen or persist or new concerns arise.  Patient Instructions  BEFORE YOU LEAVE: -follow up: 1 month -labs  -We placed a referral for you as discussed for the colonoscopy and the ultrasound. It usually takes about 1-2 weeks to process and schedule this referral. If you have not heard from [place] regarding this appointment in 2 weeks please contact our office.  We have ordered labs or studies at this visit. It can take up to 1-2 weeks for results and processing. IF results require follow up or explanation, we will call you with instructions. Clinically stable results will be released to your [facility]. If you have not heard from  [place] or cannot find your results in [facility] in 2 weeks please contact our office at [phone].  If you are not yet signed up for [facility], please consider signing up.            Colin Benton R., DO

## 2015-12-04 NOTE — Patient Instructions (Signed)
BEFORE YOU LEAVE: -follow up: 1 month -labs  -We placed a referral for you as discussed for the colonoscopy and the ultrasound. It usually takes about 1-2 weeks to process and schedule this referral. If you have not heard from us regarding this appointment in 2 weeks please contact our office.  We have ordered labs or studies at this visit. It can take up to 1-2 weeks for results and processing. IF results require follow up or explanation, we will call you with instructions. Clinically stable results will be [facility]. If you have not heard from us or cannot find your results in [facility] in 2 weeks please contact our office at [phone].  If you are not yet signed [facility] for [facility], please consider signing up.

## 2015-12-04 NOTE — Progress Notes (Signed)
Pre visit review using our clinic review tool, if applicable. No additional management support is needed unless otherwise documented below in the visit note. Patient declined weight measurement today. 

## 2015-12-05 ENCOUNTER — Ambulatory Visit: Payer: 59 | Admitting: Adult Health

## 2015-12-06 ENCOUNTER — Telehealth: Payer: Self-pay | Admitting: Pulmonary Disease

## 2015-12-06 DIAGNOSIS — G4733 Obstructive sleep apnea (adult) (pediatric): Secondary | ICD-10-CM

## 2015-12-06 NOTE — Telephone Encounter (Signed)
   DL on cpap 1-615-10 cm water : 57%, AHI 2.1 Will decrease cpap to 5-8 cm water as pt in the past complained of bloating.   Sheena > Can you ask pt if its OK to decrease her CPAP pressure further from 5-10 cm to 5-8 cm? If so, pls ask DME and get a 1 month DL on the new settings.   Thanks.  Pollie MeyerJ. Angelo A de Dios, MD 12/06/2015, 5:42 PM [place] [facility] Pager [phone] After 3 pm or if no answer, call [phone]

## 2015-12-08 NOTE — Telephone Encounter (Signed)
Spoke with pt, aware of recs.  Order placed to decrease pressure.  Nothing further needed.

## 2015-12-09 ENCOUNTER — Encounter: Payer: Self-pay | Admitting: Gastroenterology

## 2015-12-22 ENCOUNTER — Ambulatory Visit (INDEPENDENT_AMBULATORY_CARE_PROVIDER_SITE_OTHER): Payer: 59 | Admitting: Gastroenterology

## 2015-12-22 ENCOUNTER — Encounter: Payer: Self-pay | Admitting: Gastroenterology

## 2015-12-22 VITALS — BP 122/68 | HR 76 | Ht 65.75 in | Wt 210.8 lb

## 2015-12-22 DIAGNOSIS — Z1211 Encounter for screening for malignant neoplasm of colon: Secondary | ICD-10-CM | POA: Insufficient documentation

## 2015-12-22 DIAGNOSIS — R140 Abdominal distension (gaseous): Secondary | ICD-10-CM | POA: Insufficient documentation

## 2015-12-22 DIAGNOSIS — G8929 Other chronic pain: Secondary | ICD-10-CM | POA: Diagnosis not present

## 2015-12-22 DIAGNOSIS — R1011 Right upper quadrant pain: Secondary | ICD-10-CM | POA: Diagnosis not present

## 2015-12-22 MED ORDER — NA SULFATE-K SULFATE-MG SULF 17.5-3.13-1.6 GM/177ML PO SOLN: 1.0000 | Freq: Once | ORAL | 0 refills | Status: AC

## 2015-12-22 NOTE — Patient Instructions (Signed)
You have been scheduled for a colonoscopy. Please follow written instructions given to you at your visit today.  Please pick up your prep supplies at the pharmacy within the next 1-3 days. If you use inhalers (even only as needed), please bring them with you on the day of your procedure. Your physician has requested that you go to www.startemmi.com and enter the access code given to you at your visit today. This web site gives a general overview about your procedure. However, you should still follow specific instructions given to you by our office regarding your preparation for the procedure.  [facility] on Thursday October 5th at 11:30. Please arrive 15 minutes prior to your appointment for registration. Make certain not to have anything to eat or drink 6 hours prior to your appointment. Should you need to reschedule your appointment, please contact radiology at [phone]. This test typically takes about 30 minutes to perform.  We have sent the following medications to your pharmacy for you to pick up at your convenience: Suprep  Coupon for FD guard given.

## 2015-12-22 NOTE — Progress Notes (Signed)
     12/22/2015 Carmen Knight [id number] 06/25/1965   HISTORY OF PRESENT ILLNESS:  This is a pleasant 50 year old female. She has been referred here by her PCP, Dr. Maudie Mercury, to discuss and schedule colonoscopy as well as discussing some GI symptoms. She's never undergone colonoscopy in the past. She denies any bowel issues for signs of bleeding. She does admit to some right upper quadrant abdominal discomfort that she has been experiencing for the past month. She says that she had been under a lot of stress while she was caring for her mother who recently passed away from stomach cancer. This pain started around that time. She says it is intermittent and is sometimes worse with eating, but not always. She also describes being very gassy and bloated with some abdominal distention at times. She has tried Prilosec and other antacids including simethicone with minimal relief. She denies any heartburn/indigestion/reflux type symptoms.  Her PCP recommended an abdominal ultrasound, but she was waiting for this appointment before proceeding. She denies any nausea or vomiting.   Past Medical History:  Diagnosis Date  . Sleep apnea    Past Surgical History:  Procedure Laterality Date  . ABDOMINAL HYSTERECTOMY    . FOOT SURGERY      reports that she has never smoked. She does not have any smokeless tobacco history on file. She reports that she does not drink alcohol or use drugs. family history includes Prostate cancer in her father; Stomach cancer in her mother. No Known Allergies    Outpatient Encounter Prescriptions as of 12/22/2015  Medication Sig  . fexofenadine (ALLEGRA) 180 MG tablet Take 180 mg by mouth as needed for allergies or rhinitis. Reported on 07/02/2015  . Na Sulfate-K Sulfate-Mg Sulf 17.5-3.13-1.6 GM/180ML SOLN Take 1 kit by mouth once.   No facility-administered encounter medications on file as of 12/22/2015.      REVIEW OF SYSTEMS  : All other systems reviewed and negative except  where noted in the History of Present Illness.   PHYSICAL EXAM: BP 122/68   Pulse 76   Ht 5' 5.75" (1.67 m)   Wt 210 lb 12.8 oz (95.6 kg)   SpO2 98%   BMI 34.28 kg/m  General: Well developed white female in no acute distress Head: Normocephalic and atraumatic Eyes:  Sclerae anicteric, conjunctiva pink. Ears: Normal auditory acuity Lungs: Clear throughout to auscultation Heart: Regular rate and rhythm Abdomen: Soft, non-distended.  Normal bowel sounds.  Mild RUQ TTP. Rectal:  Will be done at the time of colonoscopy. Musculoskeletal: Symmetrical with no gross deformities  Skin: No lesions on visible extremities Extremities: No edema  Neurological: Alert oriented x 4, grossly non-focal Psychological:  Alert and cooperative. Normal mood and affect  ASSESSMENT AND PLAN: -Screening colonoscopy:  Will schedule with Dr. Ardis Hughs.  The risks, benefits, and alternatives to colonoscopy were discussed with the patient and she consents to proceed.  -RUQ abdominal pain with gas/bloating:  ? Gallbladder source vs dyspepsia/stress related vs musculoskeletal source.  Will start with abdominal ultrasound.  Will try FDgard OTC for symptomatic relief.     CC:  Lucretia Kern, DO

## 2015-12-23 NOTE — Progress Notes (Signed)
I agree with the above note, plan 

## 2015-12-25 ENCOUNTER — Ambulatory Visit (HOSPITAL_COMMUNITY)
Admission: RE | Admit: 2015-12-25 | Discharge: 2015-12-25 | Disposition: A | Discharge status: Home / Self Care | Payer: 59 | Source: Ambulatory Visit | Attending: Gastroenterology | Admitting: Gastroenterology

## 2015-12-25 DIAGNOSIS — K760 Fatty (change of) liver, not elsewhere classified: Secondary | ICD-10-CM | POA: Insufficient documentation

## 2015-12-25 DIAGNOSIS — R140 Abdominal distension (gaseous): Secondary | ICD-10-CM

## 2015-12-25 DIAGNOSIS — R1011 Right upper quadrant pain: Secondary | ICD-10-CM | POA: Insufficient documentation

## 2015-12-25 DIAGNOSIS — G8929 Other chronic pain: Secondary | ICD-10-CM | POA: Diagnosis not present

## 2016-01-05 NOTE — Progress Notes (Deleted)
  HPI:  Follow up.   ROS: See pertinent positives and negatives per HPI.  Past Medical History:  Diagnosis Date  . Sleep apnea     Past Surgical History:  Procedure Laterality Date  . ABDOMINAL HYSTERECTOMY    . FOOT SURGERY      Family History  Problem Relation Age of Onset  . Prostate cancer Father   . Stomach cancer Mother     passed away with stomach cancer     Social History   Social History  . Marital status: Married    Spouse name: N/A  . Number of children: N/A  . Years of education: N/A   Social History Main Topics  . Smoking status: Never Smoker  . Smokeless tobacco: Not on file  . Alcohol use No  . Drug use: No  . Sexual activity: Not on file   Other Topics Concern  . Not on file   Social History Narrative  . No narrative on file     Current Outpatient Prescriptions:  .  fexofenadine (ALLEGRA) 180 MG tablet, Take 180 mg by mouth as needed for allergies or rhinitis. Reported on 07/02/2015, Disp: , Rfl:   EXAM:  There were no vitals filed for this visit.  There is no height or weight on file to calculate BMI.  GENERAL: vitals reviewed and listed above, alert, oriented, appears well hydrated and in no acute distress  HEENT: atraumatic, conjunttiva clear, no obvious abnormalities on inspection of external nose and ears  NECK: no obvious masses on inspection  LUNGS: clear to auscultation bilaterally, no wheezes, rales or rhonchi, good air movement  CV: HRRR, no peripheral edema  MS: moves all extremities without noticeable abnormality  PSYCH: pleasant and cooperative, no obvious depression or anxiety  ASSESSMENT AND PLAN:  Discussed the following assessment and plan:  No diagnosis found.  -Patient advised to return or notify a doctor immediately if symptoms worsen or persist or new concerns arise.  There are no Patient Instructions on file for this visit.  Kriste BasqueKIM, Jadee Golebiewski R., DO

## 2016-01-06 ENCOUNTER — Ambulatory Visit: Payer: 59 | Admitting: Family Medicine

## 2016-01-07 ENCOUNTER — Encounter: Payer: Self-pay | Admitting: Pulmonary Disease

## 2016-01-27 ENCOUNTER — Encounter: Payer: Self-pay | Admitting: Gastroenterology

## 2016-02-06 ENCOUNTER — Encounter: Payer: 59 | Admitting: Gastroenterology

## 2016-02-16 ENCOUNTER — Telehealth: Payer: Self-pay | Admitting: Gastroenterology

## 2016-02-16 NOTE — Telephone Encounter (Signed)
Pt rescheduled her procedure and wanted to be re instructed.  We discussed the prep and she verbalized understanding of all the information.  She will call back with any further concerns or questions.

## 2016-02-18 ENCOUNTER — Encounter: Payer: Self-pay | Admitting: Gastroenterology

## 2016-02-18 ENCOUNTER — Ambulatory Visit (AMBULATORY_SURGERY_CENTER): Payer: 59 | Admitting: Gastroenterology

## 2016-02-18 VITALS — BP 117/84 | HR 58 | Temp 95.3°F | Resp 12 | Ht 67.5 in | Wt 210.0 lb

## 2016-02-18 DIAGNOSIS — Z1212 Encounter for screening for malignant neoplasm of rectum: Secondary | ICD-10-CM

## 2016-02-18 DIAGNOSIS — Z1211 Encounter for screening for malignant neoplasm of colon: Secondary | ICD-10-CM

## 2016-02-18 MED ORDER — SODIUM CHLORIDE 0.9 % IV SOLN: 500.0000 mL | INTRAVENOUS | Status: DC

## 2016-02-18 NOTE — Patient Instructions (Signed)
YOU HAD AN ENDOSCOPIC PROCEDURE TODAY AT THE [facility]:   Refer to the procedure report that was given to you for any specific questions about what was found during the examination.  If the procedure report does not answer your questions, please call your gastroenterologist to clarify.  If you requested that your care partner not be given the details of your procedure findings, then the procedure report has been included in a sealed envelope for you to review at your convenience later.  YOU SHOULD EXPECT: Some feelings of bloating in the abdomen. Passage of more gas than usual.  Walking can help get rid of the air that was put into your GI tract during the procedure and reduce the bloating. If you had a lower endoscopy (such as a colonoscopy or flexible sigmoidoscopy) you may notice spotting of blood in your stool or on the toilet paper. If you underwent a bowel prep for your procedure, you may not have a normal bowel movement for a few days.  Please Note:  You might notice some irritation and congestion in your nose or some drainage.  This is from the oxygen used during your procedure.  There is no need for concern and it should clear up in a day or so.  SYMPTOMS TO REPORT IMMEDIATELY:   Following lower endoscopy (colonoscopy or flexible sigmoidoscopy):  Excessive amounts of blood in the stool  Significant tenderness or worsening of abdominal pains  Swelling of the abdomen that is new, acute  Fever of 100F or higher   For urgent or emergent issues, a gastroenterologist can be reached at any hour by calling [phone].   DIET:  We do recommend a small meal at first, but then you may proceed to your regular diet.  Drink plenty of fluids but you should avoid alcoholic beverages for 24 hours.  ACTIVITY:  You should plan to take it easy for the rest of today and you should NOT DRIVE or use heavy machinery until tomorrow (because of the sedation medicines used during the test).     FOLLOW UP: Our staff will call the number listed on your records the next business day following your procedure to check on you and address any questions or concerns that you may have regarding the information given to you following your procedure. If we do not reach you, we will leave a message.  However, if you are feeling well and you are not experiencing any problems, there is no need to return our call.  We will assume that you have returned to your regular daily activities without incident.  If any biopsies were taken you will be contacted by phone or by letter within the next 1-3 weeks.  Please call us at [phone] if you have not heard about the biopsies in 3 weeks.    SIGNATURES/CONFIDENTIALITY: You and/or your care partner have signed paperwork which will be entered into your electronic medical record.  These signatures attest to the fact that that the information above on your After Visit Summary has been reviewed and is understood.  Full responsibility of the confidentiality of this discharge information lies with you and/or your care-partner.   Resume medications.  

## 2016-02-18 NOTE — Progress Notes (Signed)
A/ox3 pleased with MAC, report to Sheila RN 

## 2016-02-18 NOTE — Op Note (Signed)
[facility] Patient Name: Carmen PancoastKaren Knight Procedure Date: 02/18/2016 9:53 AM MRN: [id number] Endoscopist: Rachael Feeaniel P Georganne Siple , MD Age: [age] Referring MD:  Date of Birth: [birth year] Gender: Female Account #: [id number] Procedure:                Colonoscopy Indications:              Screening for colorectal malignant neoplasm Medicines:                Monitored Anesthesia Care Procedure:                Pre-Anesthesia Assessment:                           - Prior to the procedure, a History and Physical                            was performed, and patient medications and                            allergies were reviewed. The patient's tolerance of                            previous anesthesia was also reviewed. The risks                            and benefits of the procedure and the sedation                            options and risks were discussed with the patient.                            All questions were answered, and informed consent                            was obtained. Prior Anticoagulants: The patient has                            taken no previous anticoagulant or antiplatelet                            agents. ASA Grade Assessment: II - A patient with                            mild systemic disease. After reviewing the risks                            and benefits, the patient was deemed in                            satisfactory condition to undergo the procedure.                           After obtaining informed consent, the colonoscope  was passed under direct vision. Throughout the                            procedure, the patient's blood pressure, pulse, and                            oxygen saturations were monitored continuously. The                            Model CF-HQ190L [id number]) scope was introduced                            through the anus and advanced to the the cecum,                            identified by  appendiceal orifice and ileocecal                            valve. The colonoscopy was performed without                            difficulty. The patient tolerated the procedure                            well. The quality of the bowel preparation was                            good. The ileocecal valve, appendiceal orifice, and                            rectum were photographed. Scope In: 10:20:23 AM Scope Out: 10:28:48 AM Scope Withdrawal Time: 0 hours 6 minutes 4 seconds  Total Procedure Duration: 0 hours 8 minutes 25 seconds  Findings:                 The entire examined colon appeared normal on direct                            and retroflexion views. Complications:            No immediate complications. Estimated blood loss:                            None. Estimated Blood Loss:     Estimated blood loss: none. Impression:               - The entire examined colon is normal on direct and                            retroflexion views.                           - No polyps or cancers Recommendation:           - Patient has a contact number available for  emergencies. The signs and symptoms of potential                            delayed complications were discussed with the                            patient. Return to normal activities tomorrow.                            Written discharge instructions were provided to the                            patient.                           - Resume previous diet.                           - Continue present medications.                           - Repeat colonoscopy in 10 years for screening                            purposes. Rachael Feeaniel P Jesper Stirewalt, MD 02/18/2016 10:34:25 AM This report has been signed electronically.

## 2016-02-19 ENCOUNTER — Telehealth: Payer: Self-pay

## 2016-02-19 ENCOUNTER — Telehealth: Payer: Self-pay | Admitting: *Deleted

## 2016-02-19 NOTE — Telephone Encounter (Signed)
  Follow up Call-  Call back number 02/18/2016  Post procedure Call Back phone  # [phone]  Permission to leave phone message Yes  Some recent data might be hidden    Patient was called for follow up after her procedure on 02/18/2016. No answer at the number given for follow up phone call. I was not able to leave a message.

## 2016-02-19 NOTE — Telephone Encounter (Signed)
Message left

## 2016-04-12 DIAGNOSIS — G4733 Obstructive sleep apnea (adult) (pediatric): Secondary | ICD-10-CM | POA: Diagnosis not present

## 2016-05-13 DIAGNOSIS — G4733 Obstructive sleep apnea (adult) (pediatric): Secondary | ICD-10-CM | POA: Diagnosis not present

## 2016-06-10 DIAGNOSIS — G4733 Obstructive sleep apnea (adult) (pediatric): Secondary | ICD-10-CM | POA: Diagnosis not present

## 2016-12-31 DIAGNOSIS — Z23 Encounter for immunization: Secondary | ICD-10-CM | POA: Diagnosis not present

## 2017-07-25 ENCOUNTER — Encounter: Payer: Self-pay | Admitting: Family Medicine

## 2017-07-28 ENCOUNTER — Ambulatory Visit (INDEPENDENT_AMBULATORY_CARE_PROVIDER_SITE_OTHER): Payer: 59 | Admitting: Family Medicine

## 2017-07-28 ENCOUNTER — Encounter: Payer: Self-pay | Admitting: Family Medicine

## 2017-07-28 VITALS — BP 98/60 | HR 88 | Temp 98.5°F | Ht 67.5 in | Wt 212.6 lb

## 2017-07-28 DIAGNOSIS — J40 Bronchitis, not specified as acute or chronic: Secondary | ICD-10-CM

## 2017-07-28 DIAGNOSIS — J989 Respiratory disorder, unspecified: Secondary | ICD-10-CM | POA: Diagnosis not present

## 2017-07-28 MED ORDER — BENZONATATE 100 MG PO CAPS: 100.0000 mg | ORAL_CAPSULE | Freq: Three times a day (TID) | ORAL | 0 refills | Status: DC | PRN

## 2017-07-28 MED ORDER — ALBUTEROL SULFATE HFA 108 (90 BASE) MCG/ACT IN AERS: 2.0000 | INHALATION_SPRAY | Freq: Four times a day (QID) | RESPIRATORY_TRACT | 0 refills | Status: DC | PRN

## 2017-07-28 NOTE — Progress Notes (Signed)
HPI:  Using dictation device. Unfortunately this device frequently misinterprets words/phrases.   Acute visit for respiratory illness: -started: 4-5 days ago -symptoms:nasal congestion, sore throat, ears feel full, cough, occ wheezy cough (reports has always gotten this when has a cold and was told in the past may have viral induced cough asthma - has used alb for this in the past and requests refill) -denies:fever, SOB, NVD, tooth pain -has tried: nyquil -sick contacts/travel/risks: no reported flu, strep or tick exposure, did travel for honeymoon recently -Hx of: ? Cough variant asthma, mild  ROS: See pertinent positives and negatives per HPI.  Past Medical History:  Diagnosis Date  . Sleep apnea     Past Surgical History:  Procedure Laterality Date  . ABDOMINAL HYSTERECTOMY    . FOOT SURGERY      Family History  Problem Relation Age of Onset  . Prostate cancer Father   . Stomach cancer Mother        passed away with stomach cancer     Social History   Socioeconomic History  . Marital status: Married    Spouse name: Not on file  . Number of children: Not on file  . Years of education: Not on file  . Highest education level: Not on file  Occupational History  . Not on file  Social Needs  . Financial resource strain: Not on file  . Food insecurity:    Worry: Not on file    Inability: Not on file  . Transportation needs:    Medical: Not on file    Non-medical: Not on file  Tobacco Use  . Smoking status: Never Smoker  . Smokeless tobacco: Never Used  Substance and Sexual Activity  . Alcohol use: Yes    Alcohol/week: 1.2 oz    Types: 2 Glasses of wine per week  . Drug use: No  . Sexual activity: Not on file  Lifestyle  . Physical activity:    Days per week: Not on file    Minutes per session: Not on file  . Stress: Not on file  Relationships  . Social connections:    Talks on phone: Not on file    Gets together: Not on file    Attends religious  service: Not on file    Active member of club or organization: Not on file    Attends meetings of clubs or organizations: Not on file    Relationship status: Not on file  Other Topics Concern  . Not on file  Social History Narrative  . Not on file     Current Outpatient Medications:  .  fexofenadine (ALLEGRA) 180 MG tablet, Take 180 mg by mouth as needed for allergies or rhinitis. Reported on 07/02/2015, Disp: , Rfl:  .  albuterol (PROVENTIL HFA;VENTOLIN HFA) 108 (90 Base) MCG/ACT inhaler, Inhale 2 puffs into the lungs every 6 (six) hours as needed., Disp: 1 Inhaler, Rfl: 0 .  benzonatate (TESSALON PERLES) 100 MG capsule, Take 1 capsule (100 mg total) by mouth 3 (three) times daily as needed., Disp: 20 capsule, Rfl: 0  Current Facility-Administered Medications:  .  0.9 %  sodium chloride infusion, 500 mL, Intravenous, Continuous, Rachael Fee, MD  EXAM:  Vitals:   07/28/17 1112  BP: 98/60  Pulse: 88  Temp: 98.5 F (36.9 C)  SpO2: 98%    Body mass index is 32.81 kg/m.  GENERAL: vitals reviewed and listed above, alert, oriented, appears well hydrated and in no acute distress  HEENT: atraumatic, conjunttiva clear, no obvious abnormalities on inspection of external nose and ears, normal appearance of ear canals and TMs, clear nasal congestion, mild post oropharyngeal erythema with PND, no tonsillar edema or exudate, no sinus TTP  NECK: no obvious masses on inspection  LUNGS: clear to auscultation bilaterally, no wheezes, rales or rhonchi, good air movement  CV: HRRR, no peripheral edema  MS: moves all extremities without noticeable abnormality  PSYCH: pleasant and cooperative, no obvious depression or anxiety  ASSESSMENT AND PLAN:  Discussed the following assessment and plan:  Respiratory illness  Bronchitis  -given HPI and exam findings today, a serious infection or illness is unlikely. We discussed potential etiologies, with VURI being most likely, and advised  supportive care and monitoring. We discussed treatment side effects, likely course, antibiotic misuse, transmission, and signs of developing a serious illness. -tessalon for cough, albuterol for prn use -of course, we advised to return or notify a doctor immediately if symptoms worsen or persist or new concerns arise.    Patient Instructions  INSTRUCTIONS FOR UPPER RESPIRATORY INFECTION:  -use the inhaler as needed for wheezing, excessive cough, asthma type symptoms  -nasal saline wash 2-3 times daily (use prepackaged nasal saline or bottled/distilled water if making your own)   -can use AFRIN nasal spray for drainage and nasal congestion - but do NOT use longer then 3-4 days  -can use tylenol (in no history of liver disease) or ibuprofen (if no history of kidney disease, bowel bleeding or significant heart disease) as directed for aches and sorethroat  -in the winter time, using a humidifier at night is helpful (please follow cleaning instructions)  -if you are taking a cough medication - use only as directed, may also try a teaspoon of honey to coat the throat and throat lozenges. If given a cough medication with codeine or hydrocodone or other narcotic please be advised that this contains a strong and  potentially addicting medication. Please follow instructions carefully, take as little as possible and only use AS NEEDED for severe cough. Discuss potential side effects with your pharmacy. Please do not drive or operate machinery while taking these types of medications. Please do not take other sedating medications, drugs or alcohol while taking this medication without discussing with your doctor.  -for sore throat, salt water gargles can help  -follow up if you have fevers, facial pain, tooth pain, difficulty breathing or are worsening or symptoms persist longer then expected  Upper Respiratory Infection, Adult An upper respiratory infection (URI) is also known as the common cold. It is  often caused by a type of germ (virus). Colds are easily spread (contagious). You can pass it to others by kissing, coughing, sneezing, or drinking out of the same glass. Usually, you get better in 1 to 3  weeks.  However, the cough can last for even longer. HOME CARE   Only take medicine as told by your doctor. Follow instructions provided above.  Drink enough water and fluids to keep your pee (urine) clear or pale yellow.  Get plenty of rest.  Return to work when your temperature is < 100 for 24 hours or as told by your doctor. You may use a face mask and wash your hands to stop your cold from spreading. GET HELP RIGHT AWAY IF:   After the first few days, you feel you are getting worse.  You have questions about your medicine.  You have chills, shortness of breath, or red spit (mucus).  You have  pain in the face for more then 1-2 days, especially when you bend forward.  You have a fever, puffy (swollen) neck, pain when you swallow, or white spots in the back of your throat.  You have a bad headache, ear pain, sinus pain, or chest pain.  You have a high-pitched whistling sound when you breathe in and out (wheezing).  You cough up blood.  You have sore muscles or a stiff neck. MAKE SURE YOU:   Understand these instructions.  Will watch your condition.  Will get help right away if you are not doing well or get worse. Document Released: 08/25/2007 Document Revised: 05/31/2011 Document Reviewed: 06/13/2013 [facility] Patient Information 2015 [place], [place]. This information is not intended to replace advice given to you by your health care provider. Make sure you discuss any questions you have with your health care provider.    Terressa Koyanagi, DO

## 2017-07-28 NOTE — Patient Instructions (Signed)
INSTRUCTIONS FOR UPPER RESPIRATORY INFECTION:  -use the inhaler as needed for wheezing, excessive cough, asthma type symptoms  -nasal saline wash 2-3 times daily (use prepackaged nasal saline or bottled/distilled water if making your own)   -can use AFRIN nasal spray for drainage and nasal congestion - but do NOT use longer then 3-4 days  -can use tylenol (in no history of liver disease) or ibuprofen (if no history of kidney disease, bowel bleeding or significant heart disease) as directed for aches and sorethroat  -in the winter time, using a humidifier at night is helpful (please follow cleaning instructions)  -if you are taking a cough medication - use only as directed, may also try a teaspoon of honey to coat the throat and throat lozenges. If given a cough medication with codeine or hydrocodone or other narcotic please be advised that this contains a strong and  potentially addicting medication. Please follow instructions carefully, take as little as possible and only use AS NEEDED for severe cough. Discuss potential side effects with your pharmacy. Please do not drive or operate machinery while taking these types of medications. Please do not take other sedating medications, drugs or alcohol while taking this medication without discussing with your doctor.  -for sore throat, salt water gargles can help  -follow up if you have fevers, facial pain, tooth pain, difficulty breathing or are worsening or symptoms persist longer then expected  Upper Respiratory Infection, Adult An upper respiratory infection (URI) is also known as the common cold. It is often caused by a type of germ (virus). Colds are easily spread (contagious). You can pass it to others by kissing, coughing, sneezing, or drinking out of the same glass. Usually, you get better in 1 to 3  weeks.  However, the cough can last for even longer. HOME CARE   Only take medicine as told by your doctor. Follow instructions provided  above.  Drink enough water and fluids to keep your pee (urine) clear or pale yellow.  Get plenty of rest.  Return to work when your temperature is < 100 for 24 hours or as told by your doctor. You may use a face mask and wash your hands to stop your cold from spreading. GET HELP RIGHT AWAY IF:   After the first few days, you feel you are getting worse.  You have questions about your medicine.  You have chills, shortness of breath, or red spit (mucus).  You have pain in the face for more then 1-2 days, especially when you bend forward.  You have a fever, puffy (swollen) neck, pain when you swallow, or white spots in the back of your throat.  You have a bad headache, ear pain, sinus pain, or chest pain.  You have a high-pitched whistling sound when you breathe in and out (wheezing).  You cough up blood.  You have sore muscles or a stiff neck. MAKE SURE YOU:   Understand these instructions.  Will watch your condition.  Will get help right away if you are not doing well or get worse. Document Released: 08/25/2007 Document Revised: 05/31/2011 Document Reviewed: 06/13/2013 [facility] Patient Information 2015 [place], [place]. This information is not intended to replace advice given to you by your health care provider. Make sure you discuss any questions you have with your health care provider.

## 2017-07-31 NOTE — Progress Notes (Signed)
HPI:  Using dictation device. Unfortunately this device frequently misinterprets words/phrases.  Here for CPE:  -Concerns and/or follow up today: none  Due for labs, pap, mammo  Resp illness resolving.  -Diet: variety of foods, balance and well rounded, larger portion sizes -Exercise: regular exercise -Taking folic acid, vitamin D or calcium: no -Diabetes and Dyslipidemia Screening: not fasting -Vaccines: see vaccine section EPIC -pap history: 03/2013 per epic, s/p hysterectomy for bleeding, but may still have cervix -FDLMP: see nursing notes -sexual activity: yes, female partner, no new partners -wants STI testing (Hep C if born 60-65): no -FH breast, colon or ovarian ca: see FH Last mammogram: 06/2015  Per epic w/ Roxy Cedar, no hpv testing Last colon cancer screening: colonoscopy 01/2016, 10 years per Epic documentation Breast Ca Risk Assessment: see family history and pt history DEXA (>/= 65): n/a  -Alcohol, Tobacco, drug use: see social history  Review of Systems - no fevers, unintentional weight loss, vision loss, hearing loss, chest pain, sob, hemoptysis, melena, hematochezia, hematuria, genital discharge, changing or concerning skin lesions, bleeding, bruising, loc, thoughts of self harm or SI  Past Medical History:  Diagnosis Date  . Sleep apnea     Past Surgical History:  Procedure Laterality Date  . ABDOMINAL HYSTERECTOMY    . FOOT SURGERY      Family History  Problem Relation Age of Onset  . Prostate cancer Father   . Stomach cancer Mother        passed away with stomach cancer     Social History   Socioeconomic History  . Marital status: Married    Spouse name: Not on file  . Number of children: Not on file  . Years of education: Not on file  . Highest education level: Not on file  Occupational History  . Not on file  Social Needs  . Financial resource strain: Not on file  . Food insecurity:    Worry: Not on file    Inability: Not on  file  . Transportation needs:    Medical: Not on file    Non-medical: Not on file  Tobacco Use  . Smoking status: Never Smoker  . Smokeless tobacco: Never Used  Substance and Sexual Activity  . Alcohol use: Yes    Alcohol/week: 1.2 oz    Types: 2 Glasses of wine per week  . Drug use: No  . Sexual activity: Not on file  Lifestyle  . Physical activity:    Days per week: Not on file    Minutes per session: Not on file  . Stress: Not on file  Relationships  . Social connections:    Talks on phone: Not on file    Gets together: Not on file    Attends religious service: Not on file    Active member of club or organization: Not on file    Attends meetings of clubs or organizations: Not on file    Relationship status: Not on file  Other Topics Concern  . Not on file  Social History Narrative  . Not on file     Current Outpatient Medications:  .  albuterol (PROVENTIL HFA;VENTOLIN HFA) 108 (90 Base) MCG/ACT inhaler, Inhale 2 puffs into the lungs every 6 (six) hours as needed., Disp: 1 Inhaler, Rfl: 0 .  benzonatate (TESSALON PERLES) 100 MG capsule, Take 1 capsule (100 mg total) by mouth 3 (three) times daily as needed., Disp: 20 capsule, Rfl: 0 .  fexofenadine (ALLEGRA) 180 MG tablet, Take 180 mg  by mouth as needed for allergies or rhinitis. Reported on 07/02/2015, Disp: , Rfl:   Current Facility-Administered Medications:  .  0.9 %  sodium chloride infusion, 500 mL, Intravenous, Continuous, Milus Banister, MD  EXAM:  Vitals:   08/01/17 1119  BP: 100/74  Pulse: 68  Temp: 98.4 F (36.9 C)   Body mass index is 32.85 kg/m.  GENERAL: vitals reviewed and listed below, alert, oriented, appears well hydrated and in no acute distress  HEENT: head atraumatic, PERRLA, normal appearance of eyes, ears, nose and mouth. moist mucus membranes.  NECK: supple, no masses or lymphadenopathy  LUNGS: clear to auscultation bilaterally, no rales, rhonchi or wheeze  CV: HRRR, no peripheral  edema or cyanosis, normal pedal pulses  ABDOMEN: bowel sounds normal, soft, non tender to palpation, no masses, no rebound or guarding  BREAST: normal appearance - no skin lesions or discharge noted on inspection of both breasts, on palpation of both breast and axillary region no suspicious lesions appreciated today  GU: normal appearance of external genitalia - no lesions or masses appreciated, normal appearing vaginal mucosa - no abnormal discharge, normal appearance of cervix - no lesions or abnormal discharge observed  RECTAL: deferred  SKIN: no rash or abnormal lesions  MS: normal gait, moves all extremities normally  NEURO: normal gait, speech and thought processing grossly intact, muscle tone grossly intact throughout  PSYCH: normal affect, pleasant and cooperative  ASSESSMENT AND PLAN:  Discussed the following assessment and plan:  PREVENTIVE EXAM: -Discussed and advised all [place] preventive services health task force level A and B recommendations for age, sex and risks. -Advised at least 150 minutes of exercise per week and a healthy diet  - Hemoglobin A1c - HDL cholesterol - Cholesterol, total  2. BMI 32.0-32.9,adult -lifestyle recs, see pt instructions  3. Cervical cancer screening - PAP [[place]]   Patient advised to return to clinic immediately if symptoms worsen or persist or new concerns.  Patient Instructions  BEFORE YOU LEAVE: -labs -follow up: yearly for physical  Please get your mammogram as soon as possible.  We have ordered labs or studies at this visit. It can take up to 1-2 weeks for results and processing. IF results require follow up or explanation, we will call you with instructions. Clinically stable results will be released to your [facility]. If you have not heard from [place] or cannot find your results in [facility] in 2 weeks please contact our office at [phone].  If you are not yet signed up for [facility], please consider signing  up.    Preventive Care 40-64 Years, Female Preventive care refers to lifestyle choices and visits with your health care provider that can promote health and wellness. What does preventive care include?  A yearly physical exam. This is also called an annual well check.  Dental exams once or twice a year.  Routine eye exams. Ask your health care provider how often you should have your eyes checked.  Personal lifestyle choices, including: ? Daily care of your teeth and gums. ? Regular physical activity. ? Eating a healthy diet. ? Avoiding tobacco and drug use. ? Limiting alcohol use. ? Practicing safe sex. ? Taking low-dose aspirin daily starting at age 54. ? Taking vitamin and mineral supplements as recommended by your health care provider. What happens during an annual well check? The services and screenings done by your health care provider during your annual well check will depend on your age, overall health, lifestyle risk factors, and  family history of disease. Counseling Your health care provider may ask you questions about your:  Alcohol use.  Tobacco use.  Drug use.  Emotional well-being.  Home and relationship well-being.  Sexual activity.  Eating habits.  Work and work Statistician.  Method of birth control.  Menstrual cycle.  Pregnancy history.  Screening You may have the following tests or measurements:  Height, weight, and BMI.  Blood pressure.  Lipid and cholesterol levels. These may be checked every 5 years, or more frequently if you are over 43 years old.  Skin check.  Lung cancer screening. You may have this screening every year starting at age 51 if you have a 30-pack-year history of smoking and currently smoke or have quit within the past 15 years.  Fecal occult blood test (FOBT) of the stool. You may have this test every year starting at age 99.  Flexible sigmoidoscopy or colonoscopy. You may have a sigmoidoscopy every 5 years or a  colonoscopy every 10 years starting at age 20.  Hepatitis C blood test.  Hepatitis B blood test.  Sexually transmitted disease (STD) testing.  Diabetes screening. This is done by checking your blood sugar (glucose) after you have not eaten for a while (fasting). You may have this done every 1-3 years.  Mammogram. This may be done every 1-2 years. Talk to your health care provider about when you should start having regular mammograms. This may depend on whether you have a family history of breast cancer.  BRCA-related cancer screening. This may be done if you have a family history of breast, ovarian, tubal, or peritoneal cancers.  Pelvic exam and Pap test. This may be done every 3 years starting at age 64. Starting at age 49, this may be done every 5 years if you have a Pap test in combination with an HPV test.  Bone density scan. This is done to screen for osteoporosis. You may have this scan if you are at high risk for osteoporosis.  Discuss your test results, treatment options, and if necessary, the need for more tests with your health care provider. Vaccines Your health care provider may recommend certain vaccines, such as:  Influenza vaccine. This is recommended every year.  Tetanus, diphtheria, and acellular pertussis (Tdap, Td) vaccine. You may need a Td booster every 10 years.  Varicella vaccine. You may need this if you have not been vaccinated.  Zoster vaccine. You may need this after age 23.  Measles, mumps, and rubella (MMR) vaccine. You may need at least one dose of MMR if you were born in 1957 or later. You may also need a second dose.  Pneumococcal 13-valent conjugate (PCV13) vaccine. You may need this if you have certain conditions and were not previously vaccinated.  Pneumococcal polysaccharide (PPSV23) vaccine. You may need one or two doses if you smoke cigarettes or if you have certain conditions.  Meningococcal vaccine. You may need this if you have certain  conditions.  Hepatitis A vaccine. You may need this if you have certain conditions or if you travel or work in places where you may be exposed to hepatitis A.  Hepatitis B vaccine. You may need this if you have certain conditions or if you travel or work in places where you may be exposed to hepatitis B.  Haemophilus influenzae type b (Hib) vaccine. You may need this if you have certain conditions.  Talk to your health care provider about which screenings and vaccines you need and how often you need  them. This information is not intended to replace advice given to you by your health care provider. Make sure you discuss any questions you have with your health care provider. Document Released: 04/04/2015 Document Revised: 11/26/2015 Document Reviewed: 01/07/2015 Elsevier Interactive Patient Education  2018 [employer].         No follow-ups on file.  Lucretia Kern, DO

## 2017-08-01 ENCOUNTER — Ambulatory Visit (INDEPENDENT_AMBULATORY_CARE_PROVIDER_SITE_OTHER): Payer: 59 | Admitting: Family Medicine

## 2017-08-01 ENCOUNTER — Encounter: Payer: Self-pay | Admitting: Family Medicine

## 2017-08-01 ENCOUNTER — Other Ambulatory Visit (HOSPITAL_COMMUNITY)
Admission: RE | Admit: 2017-08-01 | Discharge: 2017-08-01 | Disposition: A | Discharge status: Home / Self Care | Payer: 59 | Source: Ambulatory Visit | Attending: Family Medicine | Admitting: Family Medicine

## 2017-08-01 VITALS — BP 100/74 | HR 68 | Temp 98.4°F | Ht 67.25 in | Wt 211.3 lb

## 2017-08-01 DIAGNOSIS — Z6832 Body mass index (BMI) 32.0-32.9, adult: Secondary | ICD-10-CM

## 2017-08-01 DIAGNOSIS — Z124 Encounter for screening for malignant neoplasm of cervix: Secondary | ICD-10-CM | POA: Diagnosis not present

## 2017-08-01 DIAGNOSIS — Z0000 Encounter for general adult medical examination without abnormal findings: Secondary | ICD-10-CM | POA: Diagnosis not present

## 2017-08-01 LAB — HDL CHOLESTEROL: HDL: 47.8 mg/dL (ref >39.00)

## 2017-08-01 LAB — HEMOGLOBIN A1C: Hgb A1c MFr Bld: 5.9 % (ref 4.6–6.5)

## 2017-08-01 LAB — CHOLESTEROL, TOTAL: Cholesterol: 209 mg/dL — ABNORMAL HIGH (ref 0–200)

## 2017-08-01 NOTE — Patient Instructions (Addendum)
BEFORE YOU LEAVE: -labs -follow up: yearly for physical  Please get your mammogram as soon as possible. Please call to schedule today.  We have ordered labs or studies at this visit. It can take up to 1-2 weeks for results and processing. IF results require follow up or explanation, we will call you with instructions. Clinically stable results will be released to your [facility]. If you have not heard from [place] or cannot find your results in [facility] in 2 weeks please contact our office at [phone].  If you are not yet signed up for [facility], please consider signing up.    Preventive Care 40-64 Years, Female Preventive care refers to lifestyle choices and visits with your health care provider that can promote health and wellness. What does preventive care include?  A yearly physical exam. This is also called an annual well check.  Dental exams once or twice a year.  Routine eye exams. Ask your health care provider how often you should have your eyes checked.  Personal lifestyle choices, including: ? Daily care of your teeth and gums. ? Regular physical activity. ? Eating a healthy diet. ? Avoiding tobacco and drug use. ? Limiting alcohol use. ? Practicing safe sex. ? Taking low-dose aspirin daily starting at age 51. ? Taking vitamin and mineral supplements as recommended by your health care provider. What happens during an annual well check? The services and screenings done by your health care provider during your annual well check will depend on your age, overall health, lifestyle risk factors, and family history of disease. Counseling Your health care provider may ask you questions about your:  Alcohol use.  Tobacco use.  Drug use.  Emotional well-being.  Home and relationship well-being.  Sexual activity.  Eating habits.  Work and work Statistician.  Method of birth control.  Menstrual cycle.  Pregnancy history.  Screening You may have the following tests or  measurements:  Height, weight, and BMI.  Blood pressure.  Lipid and cholesterol levels. These may be checked every 5 years, or more frequently if you are over 42 years old.  Skin check.  Lung cancer screening. You may have this screening every year starting at age 69 if you have a 30-pack-year history of smoking and currently smoke or have quit within the past 15 years.  Fecal occult blood test (FOBT) of the stool. You may have this test every year starting at age 89.  Flexible sigmoidoscopy or colonoscopy. You may have a sigmoidoscopy every 5 years or a colonoscopy every 10 years starting at age 40.  Hepatitis C blood test.  Hepatitis B blood test.  Sexually transmitted disease (STD) testing.  Diabetes screening. This is done by checking your blood sugar (glucose) after you have not eaten for a while (fasting). You may have this done every 1-3 years.  Mammogram. This may be done every 1-2 years. Talk to your health care provider about when you should start having regular mammograms. This may depend on whether you have a family history of breast cancer.  BRCA-related cancer screening. This may be done if you have a family history of breast, ovarian, tubal, or peritoneal cancers.  Pelvic exam and Pap test. This may be done every 3 years starting at age 66. Starting at age 72, this may be done every 5 years if you have a Pap test in combination with an HPV test.  Bone density scan. This is done to screen for osteoporosis. You may have this scan if you are  at high risk for osteoporosis.  Discuss your test results, treatment options, and if necessary, the need for more tests with your health care provider. Vaccines Your health care provider may recommend certain vaccines, such as:  Influenza vaccine. This is recommended every year.  Tetanus, diphtheria, and acellular pertussis (Tdap, Td) vaccine. You may need a Td booster every 10 years.  Varicella vaccine. You may need this if  you have not been vaccinated.  Zoster vaccine. You may need this after age 4.  Measles, mumps, and rubella (MMR) vaccine. You may need at least one dose of MMR if you were born in 1957 or later. You may also need a second dose.  Pneumococcal 13-valent conjugate (PCV13) vaccine. You may need this if you have certain conditions and were not previously vaccinated.  Pneumococcal polysaccharide (PPSV23) vaccine. You may need one or two doses if you smoke cigarettes or if you have certain conditions.  Meningococcal vaccine. You may need this if you have certain conditions.  Hepatitis A vaccine. You may need this if you have certain conditions or if you travel or work in places where you may be exposed to hepatitis A.  Hepatitis B vaccine. You may need this if you have certain conditions or if you travel or work in places where you may be exposed to hepatitis B.  Haemophilus influenzae type b (Hib) vaccine. You may need this if you have certain conditions.  Talk to your health care provider about which screenings and vaccines you need and how often you need them. This information is not intended to replace advice given to you by your health care provider. Make sure you discuss any questions you have with your health care provider. Document Released: 04/04/2015 Document Revised: 11/26/2015 Document Reviewed: 01/07/2015 Elsevier Interactive Patient Education  [employer].

## 2017-08-02 LAB — CYTOLOGY - PAP
Adequacy: ABSENT
DIAGNOSIS: NEGATIVE
HPV (WINDOPATH): NOT DETECTED

## 2017-08-04 ENCOUNTER — Encounter: Payer: Self-pay | Admitting: Family Medicine

## 2017-08-09 ENCOUNTER — Ambulatory Visit (INDEPENDENT_AMBULATORY_CARE_PROVIDER_SITE_OTHER): Payer: 59 | Admitting: Family Medicine

## 2017-08-09 ENCOUNTER — Encounter: Payer: Self-pay | Admitting: Family Medicine

## 2017-08-09 VITALS — BP 120/70 | HR 98 | Temp 98.5°F | Ht 67.25 in | Wt 214.6 lb

## 2017-08-09 DIAGNOSIS — H6983 Other specified disorders of Eustachian tube, bilateral: Secondary | ICD-10-CM | POA: Diagnosis not present

## 2017-08-09 MED ORDER — FLUTICASONE PROPIONATE 50 MCG/ACT NA SUSP: 2.0000 | Freq: Every day | NASAL | 0 refills | Status: DC

## 2017-08-09 NOTE — Patient Instructions (Signed)
flonase 2 sprays each nostril daily for 3 weeks  Follow up if worsening, new concerns or symptoms perist

## 2017-08-09 NOTE — Progress Notes (Signed)
  HPI:  Using dictation device. Unfortunately this device frequently misinterprets words/phrases.  Acute visit for L ear issues: -cold about 2 weeks ago -now much better but occ popping and occ pain in L ear -no fevers, malaise, drainage, hearing loss  ROS: See pertinent positives and negatives per HPI.  Past Medical History:  Diagnosis Date  . Sleep apnea     Past Surgical History:  Procedure Laterality Date  . ABDOMINAL HYSTERECTOMY    . FOOT SURGERY      Family History  Problem Relation Age of Onset  . Prostate cancer Father   . Stomach cancer Mother        passed away with stomach cancer     SOCIAL HX: see hpi   Current Outpatient Medications:  .  fexofenadine (ALLEGRA) 180 MG tablet, Take 180 mg by mouth as needed for allergies or rhinitis. Reported on 07/02/2015, Disp: , Rfl:   Current Facility-Administered Medications:  .  0.9 %  sodium chloride infusion, 500 mL, Intravenous, Continuous, Rachael Fee, MD  EXAM:  Vitals:   08/09/17 1621  BP: 120/70  Pulse: 98  Temp: 98.5 F (36.9 C)    Body mass index is 33.36 kg/m.  GENERAL: vitals reviewed and listed above, alert, oriented, appears well hydrated and in no acute distress  HEENT: atraumatic, conjunttiva clear, no obvious abnormalities on inspection of external nose and ears, clear effusion R middle ear, normal canal and TM L ear, no sig LAD or other TTP around ears, minimal PND, normal inspection nose and oropharynx o/w  NECK: no obvious masses on inspection  MS: moves all extremities without noticeable abnormality  PSYCH: pleasant and cooperative, no obvious depression or anxiety  ASSESSMENT AND PLAN:  Discussed the following assessment and plan:  Dysfunction of both eustachian tubes  -suspect ETD -trial INS -Patient advised to return or notify a doctor immediately if symptoms worsen or persist or new concerns arise. May need ENT eval if persists.  Patient Instructions  flonase 2  sprays each nostril daily for 3 weeks  Follow up if worsening, new concerns or symptoms perist   Terressa Koyanagi, DO

## 2017-08-19 ENCOUNTER — Other Ambulatory Visit: Payer: Self-pay | Admitting: Family Medicine

## 2017-08-19 NOTE — Telephone Encounter (Signed)
Rx done. 

## 2017-08-19 NOTE — Telephone Encounter (Signed)
Refill once 

## 2018-06-05 ENCOUNTER — Ambulatory Visit (INDEPENDENT_AMBULATORY_CARE_PROVIDER_SITE_OTHER): Payer: 59 | Admitting: Family Medicine

## 2018-06-05 ENCOUNTER — Encounter: Payer: Self-pay | Admitting: *Deleted

## 2018-06-05 ENCOUNTER — Encounter: Payer: Self-pay | Admitting: Family Medicine

## 2018-06-05 ENCOUNTER — Other Ambulatory Visit: Payer: Self-pay

## 2018-06-05 VITALS — BP 100/60 | HR 108 | Temp 99.9°F | Resp 12 | Ht 67.25 in | Wt 218.6 lb

## 2018-06-05 DIAGNOSIS — J101 Influenza due to other identified influenza virus with other respiratory manifestations: Secondary | ICD-10-CM

## 2018-06-05 DIAGNOSIS — R112 Nausea with vomiting, unspecified: Secondary | ICD-10-CM | POA: Diagnosis not present

## 2018-06-05 LAB — POC INFLUENZA A&B (BINAX/QUICKVUE)
INFLUENZA A, POC: POSITIVE — AB
Influenza B, POC: NEGATIVE

## 2018-06-05 MED ORDER — OSELTAMIVIR PHOSPHATE 75 MG PO CAPS
75.0000 mg | ORAL_CAPSULE | Freq: Two times a day (BID) | ORAL | 0 refills | Status: AC
Start: 2018-06-05 — End: 2018-06-10

## 2018-06-05 MED ORDER — ONDANSETRON HCL 4 MG PO TABS: 4.0000 mg | ORAL_TABLET | Freq: Three times a day (TID) | ORAL | 0 refills | Status: DC | PRN

## 2018-06-05 NOTE — Patient Instructions (Addendum)
[name] I have seen you today for an acute visit.  A few things to remember from today's visit:   Nausea and vomiting in adult - Plan: ondansetron (ZOFRAN) 4 MG tablet  Influenza A - Plan: oseltamivir (TAMIFLU) 75 MG capsule     Influenza, Adult Influenza is also called "the flu." It is an infection in the lungs, nose, and throat (respiratory tract). It is caused by a virus. The flu causes symptoms that are similar to symptoms of a cold. It also causes a high fever and body aches. The flu spreads easily from person to person (is contagious). Getting a flu shot (influenza vaccination) every year is the best way to prevent the flu. What are the causes? This condition is caused by the influenza virus. You can get the virus by:  Breathing in droplets that are in the air from the cough or sneeze of a person who has the virus.  Touching something that has the virus on it (is contaminated) and then touching your mouth, nose, or eyes. What increases the risk? Certain things may make you more likely to get the flu. These include:  Not washing your hands often.  Having close contact with many people during cold and flu season.  Touching your mouth, eyes, or nose without first washing your hands.  Not getting a flu shot every year. You may have a higher risk for the flu, along with serious problems such as a lung infection (pneumonia), if you:  Are older than 65.  Are pregnant.  Have a weakened disease-fighting system (immune system) because of a disease or taking certain medicines.  Have a long-term (chronic) illness, such as: ? Heart, kidney, or lung disease. ? Diabetes. ? Asthma.  Have a liver disorder.  Are very overweight (morbidly obese).  Have anemia. This is a condition that affects your red blood cells. What are the signs or symptoms? Symptoms usually begin suddenly and last 4-14 days. They may include:  Fever and chills.  Headaches, body aches, or muscle  aches.  Sore throat.  Cough.  Runny or stuffy (congested) nose.  Chest discomfort.  Not wanting to eat as much as normal (poor appetite).  Weakness or feeling tired (fatigue).  Dizziness.  Feeling sick to your stomach (nauseous) or throwing up (vomiting). How is this treated? If the flu is found early, you can be treated with medicine that can help reduce how bad the illness is and how long it lasts (antiviral medicine). This may be given by mouth (orally) or through an IV tube. Taking care of yourself at home can help your symptoms get better. Your doctor may suggest:  Taking over-the-counter medicines.  Drinking plenty of fluids. The flu often goes away on its own. If you have very bad symptoms or other problems, you may be treated in a hospital. Follow these instructions at home:     Activity  Rest as needed. Get plenty of sleep.  Stay home from work or school as told by your doctor. ? Do not leave home until you do not have a fever for 24 hours without taking medicine. ? Leave home only to visit your doctor. Eating and drinking  Take an ORS (oral rehydration solution). This is a drink that is sold at pharmacies and stores.  Drink enough fluid to keep your pee (urine) pale yellow.  Drink clear fluids in small amounts as you are able. Clear fluids include: ? Water. ? Ice chips. ? Fruit juice that has water  added (diluted fruit juice). ? Low-calorie sports drinks.  Eat bland, easy-to-digest foods in small amounts as you are able. These foods include: ? Bananas. ? Applesauce. ? Rice. ? Lean meats. ? Toast. ? Crackers.  Do not eat or drink: ? Fluids that have a lot of sugar or caffeine. ? Alcohol. ? Spicy or fatty foods. General instructions  Take over-the-counter and prescription medicines only as told by your doctor.  Use a cool mist humidifier to add moisture to the air in your home. This can make it easier for you to breathe.  Cover your mouth and  nose when you cough or sneeze.  Wash your hands with soap and water often, especially after you cough or sneeze. If you cannot use soap and water, use alcohol-based hand sanitizer.  Keep all follow-up visits as told by your doctor. This is important. How is this prevented?   Get a flu shot every year. You may get the flu shot in late summer, fall, or winter. Ask your doctor when you should get your flu shot.  Avoid contact with people who are sick during fall and winter (cold and flu season). Contact a doctor if:  You get new symptoms.  You have: ? Chest pain. ? Watery poop (diarrhea). ? A fever.  Your cough gets worse.  You start to have more mucus.  You feel sick to your stomach.  You throw up. Get help right away if you:  Have shortness of breath.  Have trouble breathing.  Have skin or nails that turn a bluish color.  Have very bad pain or stiffness in your neck.  Get a sudden headache.  Get sudden pain in your face or ear.  Cannot eat or drink without throwing up. Summary  Influenza ("the flu") is an infection in the lungs, nose, and throat. It is caused by a virus.  Take over-the-counter and prescription medicines only as told by your doctor.  Getting a flu shot every year is the best way to avoid getting the flu. This information is not intended to replace advice given to you by your health care provider. Make sure you discuss any questions you have with your health care provider. Document Released: 12/16/2007 Document Revised: 08/24/2017 Document Reviewed: 08/24/2017 Elsevier Interactive Patient Education  2019 [employer].    In general please monitor for signs of worsening symptoms and seek immediate medical attention if any concerning.   I hope you get better soon!

## 2018-06-05 NOTE — Progress Notes (Signed)
ACUTE VISIT  HPI:  Chief Complaint  Patient presents with  . Vomiting    since 1AM, temperature of 99 degrees at home, and later 101 degrees at 12:30pm today  . Headache    noted since driving over for the appointment    [name] is a 53 y.o.female here today complaining of nausea and vomiting that started early today. 1 to 2 days ago she had diarrhea. Vomiting x 1.  She denies abdominal pain, blood in the stool, or urinary symptoms. Subjection fever and fatigue last night.  Today started with body aches and fever 99.8 and 101.0 F   Emesis   This is a new problem. The current episode started today. The problem has been unchanged. The emesis has an appearance of stomach contents. The maximum temperature recorded prior to her arrival was 101 - 101.9 F. The fever has been present for less than 1 day. Associated symptoms include chills, diarrhea, a fever, headaches and myalgias. Pertinent negatives include no abdominal pain, arthralgias, chest pain, coughing, sweats or URI. She has tried sleep and increased fluids for the symptoms. The treatment provided mild relief.   No Hx of recent travel. No sick contact.  OTC medications for this problem: Ibuprofen around 12:30 PM today  Review of Systems  Constitutional: Positive for activity change, appetite change, chills, fatigue and fever.  HENT: Negative for congestion, mouth sores and sore throat.   Respiratory: Negative for cough, shortness of breath and wheezing.   Cardiovascular: Negative for chest pain and palpitations.  Gastrointestinal: Positive for diarrhea, nausea and vomiting. Negative for abdominal pain and blood in stool.  Genitourinary: Negative for decreased urine volume, dysuria and hematuria.  Musculoskeletal: Positive for myalgias. Negative for arthralgias, back pain and neck stiffness.  Skin: Negative for rash.  Neurological: Positive for headaches. Negative for syncope, facial asymmetry and weakness.   Hematological: Negative for adenopathy. Does not bruise/bleed easily.    Current Outpatient Medications on File Prior to Visit  Medication Sig Dispense Refill  . albuterol (PROVENTIL HFA;VENTOLIN HFA) 108 (90 Base) MCG/ACT inhaler Take 2 puffs by mouth every 6 hours as needed 8.5 Inhaler 0  . fexofenadine (ALLEGRA) 180 MG tablet Take 180 mg by mouth as needed for allergies or rhinitis. Reported on 07/02/2015    . fluticasone (FLONASE) 50 MCG/ACT nasal spray Place 2 sprays into both nostrils daily. 16 g 0   Current Facility-Administered Medications on File Prior to Visit  Medication Dose Route Frequency Provider Last Rate Last Dose  . 0.9 %  sodium chloride infusion  500 mL Intravenous Continuous Rachael Fee, MD         Past Medical History:  Diagnosis Date  . Sleep apnea    No Known Allergies  Social History   Socioeconomic History  . Marital status: Married    Spouse name: Not on file  . Number of children: Not on file  . Years of education: Not on file  . Highest education level: Not on file  Occupational History  . Not on file  Social Needs  . Financial resource strain: Not on file  . Food insecurity:    Worry: Not on file    Inability: Not on file  . Transportation needs:    Medical: Not on file    Non-medical: Not on file  Tobacco Use  . Smoking status: Never Smoker  . Smokeless tobacco: Never Used  Substance and Sexual Activity  . Alcohol use: Yes  Alcohol/week: 2.0 standard drinks    Types: 2 Glasses of wine per week  . Drug use: No  . Sexual activity: Not on file  Lifestyle  . Physical activity:    Days per week: Not on file    Minutes per session: Not on file  . Stress: Not on file  Relationships  . Social connections:    Talks on phone: Not on file    Gets together: Not on file    Attends religious service: Not on file    Active member of club or organization: Not on file    Attends meetings of clubs or organizations: Not on file     Relationship status: Not on file  Other Topics Concern  . Not on file  Social History Narrative  . Not on file    Vitals:   06/05/18 1403  BP: 100/60  Pulse: (!) 108  Resp: 12  Temp: 99.9 F (37.7 C)  SpO2: 96%   Body mass index is 33.98 kg/m.   Physical Exam  Nursing note and vitals reviewed. Constitutional: She is oriented to person, place, and time. She appears well-developed. She does not appear ill. No distress.  HENT:  Head: Normocephalic and atraumatic.  Mouth/Throat: Oropharynx is clear and moist and mucous membranes are normal.  Eyes: Conjunctivae are normal. No scleral icterus.  Cardiovascular: Regular rhythm. Tachycardia present.  No murmur heard. Respiratory: Effort normal and breath sounds normal. No respiratory distress.  GI: Soft. Bowel sounds are normal. She exhibits no distension and no mass. There is no hepatomegaly. There is no abdominal tenderness.  Musculoskeletal:        General: No edema.  Lymphadenopathy:    She has no cervical adenopathy.  Neurological: She is alert and oriented to person, place, and time. She has normal strength.  Skin: Skin is warm. No rash noted. No erythema.  Psychiatric: She has a normal mood and affect.  Well groomed, good eye contact.    ASSESSMENT AND PLAN:  [name] was seen today for vomiting and headache.  Diagnoses and all orders for this visit:  Influenza A Educated about diagnosis.  Adequate hydration and rest. Instructed about warning signs. Explained that cough, congestion, and sometimes fatigue can persist for a few days and even weeks after acute symptoms are resolved.  -     oseltamivir (TAMIFLU) 75 MG capsule; Take 1 capsule (75 mg total) by mouth 2 (two) times daily for 5 days.  Nausea and vomiting in adult Frequent and a small sips of clear fluids. Symptomatic treatment with Zofran recommended. Instructed about warning signs. Follow-up as needed.  -     ondansetron (ZOFRAN) 4 MG tablet; Take  1 tablet (4 mg total) by mouth every 8 (eight) hours as needed for nausea or vomiting. -     POC Influenza A&B(BINAX/QUICKVUE)    Return if symptoms worsen or fail to improve.      Junelle Hashemi G. [place], MD  [facility]. [place] [facility].

## 2018-10-10 ENCOUNTER — Encounter: Payer: Self-pay | Admitting: Physician Assistant

## 2018-10-10 ENCOUNTER — Telehealth: Payer: 59 | Admitting: Physician Assistant

## 2018-10-10 DIAGNOSIS — B353 Tinea pedis: Secondary | ICD-10-CM

## 2018-10-10 MED ORDER — TERBINAFINE HCL 250 MG PO TABS: 250.0000 mg | ORAL_TABLET | Freq: Every day | ORAL | 0 refills | Status: DC

## 2018-10-10 NOTE — Progress Notes (Signed)
E-Visit for Athlete's Foot  We are sorry that you are not feeling well. Here is how we plan to help!  Based on what you shared with me it looks like you have tinea pedis, or "Athlete's Foot".  This type of rash can spread through shared towels, clothing, bedding, etc., as well as hard surfaces (particularly in moist areas) such as shower stalls, locker room floors, pool areas, etc. The symptoms of Athlete's Foot include red, swollen, peeling, itchy skin between the toes (especially between the pinky toe and the one next to it). The sole and heel of the foot may also be affected. In severe cases, the skin on the feet can blister.  Athlete's foot can usually be treated with over-the-counter topical antifungal products; but sometimes with chronic or extensive tinea pedis, prescription oral medications are needed.      I am prescribing:Terbinafine 250 mg once per day for one week  HOME CARE:  . Keep feet clean, dry, and cool. . Avoid using swimming pools, public showers, or foot baths. . Wear sandals when possible or air shoes out by alternating them every 2-3 days. . Avoid wearing closed shoes and wearing socks made from fabric that doesn't dry easily (for example, nylon). . Treat the infection with recommended medication  GET HELP RIGHT AWAY IF:  . Symptoms that don't go away after treatment. . Severe itching that persists. . If your rash spreads or swells. . If your rash begins to have drainage or smell. . You develop a fever.  MAKE SURE YOU   . Understand these instructions. . Will watch your condition. . Will get help right away if you are not doing well or get worse.   Thank you for choosing an e-visit.  Your e-visit answers were reviewed by a board certified advanced clinical practitioner to complete your personal care plan. Depending upon the condition, your plan could have included both over the counter or prescription medications.  Please review your pharmacy  choice. Make sure the pharmacy is open so you can pick up prescription now. If there is a problem, you may contact your provider through [employer] and have the prescription routed to another pharmacy.  Your safety is important to [place]. If you have drug allergies check your prescription carefully.   For the next 24 hours you can use MyChart to ask questions about today's visit, request a non-urgent call back, or ask for a work or school excuse.  You will get an email in the next two days asking about your experience. I hope that your e-visit has been valuable and will speed your recovery  References or for more information:  [email or web address]?search=[email or web address]&source=search_result&selectedTitle=1~104&usage_type=default&display_rank=1  [email or web address]         I spent 5-10 minutes on review and completion of this note- Lacy Duverney [facility]

## 2018-10-30 ENCOUNTER — Other Ambulatory Visit: Payer: Self-pay | Admitting: Family Medicine

## 2018-10-30 DIAGNOSIS — Z1231 Encounter for screening mammogram for malignant neoplasm of breast: Secondary | ICD-10-CM

## 2018-11-10 ENCOUNTER — Other Ambulatory Visit (INDEPENDENT_AMBULATORY_CARE_PROVIDER_SITE_OTHER): Payer: Self-pay | Admitting: Physician Assistant

## 2018-11-11 ENCOUNTER — Encounter: Payer: Self-pay | Admitting: Family Medicine

## 2018-11-14 ENCOUNTER — Telehealth (INDEPENDENT_AMBULATORY_CARE_PROVIDER_SITE_OTHER): Payer: 59 | Admitting: Family Medicine

## 2018-11-14 ENCOUNTER — Other Ambulatory Visit: Payer: Self-pay

## 2018-11-14 ENCOUNTER — Encounter: Payer: Self-pay | Admitting: Family Medicine

## 2018-11-14 DIAGNOSIS — B353 Tinea pedis: Secondary | ICD-10-CM | POA: Diagnosis not present

## 2018-11-14 DIAGNOSIS — Z79899 Other long term (current) drug therapy: Secondary | ICD-10-CM

## 2018-11-14 DIAGNOSIS — B351 Tinea unguium: Secondary | ICD-10-CM

## 2018-11-14 NOTE — Patient Instructions (Signed)
-get the labs  -once lab results return will send in the medication  -please follow up about 4-6 weeks after starting the medication  Terbinafine tablets What is this medicine? TERBINAFINE (TER bin a feen) is an antifungal medicine. It is used to treat certain kinds of fungal or yeast infections. This medicine may be used for other purposes; ask your health care provider or pharmacist if you have questions. COMMON BRAND NAME(S): Lamisil, Terbinex What should I tell my health care provider before I take this medicine? They need to know if you have any of these conditions:  drink alcoholic beverages  kidney disease  liver disease  an unusual or allergic reaction to terbinafine, other medicines, foods, dyes, or preservatives  pregnant or trying to get pregnant  breast-feeding How should I use this medicine? Take this medicine by mouth with a full glass of water. Follow the directions on the prescription label. You can take this medicine with food or on an empty stomach. Take your medicine at regular intervals. Do not take your medicine more often than directed. Do not skip doses or stop your medicine early even if you feel better. Do not stop taking except on your doctor's advice. Talk to your pediatrician regarding the use of this medicine in children. Special care may be needed. Overdosage: If you think you have taken too much of this medicine contact a poison control center or emergency room at once. NOTE: This medicine is only for you. Do not share this medicine with others. What if I miss a dose? If you miss a dose, take it as soon as you can. If it is almost time for your next dose, take only that dose. Do not take double or extra doses. What may interact with this medicine? Do not take this medicine with any of the following medications:  thioridazine This medicine may also interact with the following medications:  beta-blockers  caffeine  cimetidine  cyclosporine   medicines for depression, anxiety, or psychotic disturbances  medicines for fungal infections like fluconazole and ketoconazole  medicines for irregular heartbeat like amiodarone, flecainide and propafenone  rifampin  warfarin This list may not describe all possible interactions. Give your health care provider a list of all the medicines, herbs, non-prescription drugs, or dietary supplements you use. Also tell them if you smoke, drink alcohol, or use illegal drugs. Some items may interact with your medicine. What should I watch for while using this medicine? Visit your doctor or health care provider regularly. Tell your doctor right away if you have nausea or vomiting, loss of appetite, stomach pain on your right upper side, yellow skin, dark urine, light stools, or are over tired. Some fungal infections need many weeks or months of treatment to cure. If you are taking this medicine for a long time, you will need to have important blood work done. This medicine may cause serious skin reactions. They can happen weeks to months after starting the medicine. Contact your health care provider right away if you notice fevers or flu-like symptoms with a rash. The rash may be red or purple and then turn into blisters or peeling of the skin. Or, you might notice a red rash with swelling of the face, lips or lymph nodes in your neck or under your arms. What side effects may I notice from receiving this medicine? Side effects that you should report to your doctor or health care professional as soon as possible:  allergic reactions like skin rash or hives,  swelling of the face, lips, or tongue  changes in vision  dark urine  fever or infection  general ill feeling or flu-like symptoms  light-colored stools  loss of appetite, nausea  rash, fever, and swollen lymph nodes  redness, blistering, peeling or loosening of the skin, including inside the mouth  right upper belly pain  unusually weak or  tired  yellowing of the eyes or skin Side effects that usually do not require medical attention (report to your doctor or health care professional if they continue or are bothersome):  changes in taste  diarrhea  hair loss  muscle or joint pain  stomach gas  stomach upset This list may not describe all possible side effects. Call your doctor for medical advice about side effects. You may report side effects to FDA at 1-800-FDA-1088. Where should I keep my medicine? Keep out of the reach of children. Store at room temperature below 25 degrees C (77 degrees F). Protect from light. Throw away any unused medicine after the expiration date. NOTE: This sheet is a summary. It may not cover all possible information. If you have questions about this medicine, talk to your doctor, pharmacist, or health care provider.  2020 Elsevier/Gold Standard (2018-06-16 15:37:07)    Fungal Nail Infection A fungal nail infection is a common infection of the toenails or fingernails. This condition affects toenails more often than fingernails. It often affects the great, or big, toes. More than one nail may be infected. The condition can be passed from person to person (is contagious). What are the causes? This condition is caused by a fungus. Several types of fungi can cause the infection. These fungi are common in moist and warm areas. If your hands or feet come into contact with the fungus, it may get into a crack in your fingernail or toenail and cause the infection. What increases the risk? The following factors may make you more likely to develop this condition:  Being female.  Being of older age.  Living with someone who has the fungus.  Walking barefoot in areas where the fungus thrives, such as showers or locker rooms.  Wearing shoes and socks that cause your feet to sweat.  Having a nail injury or a recent nail surgery.  Having certain medical conditions, such as: ? Athlete's foot. ?  Diabetes. ? Psoriasis. ? Poor circulation. ? A weak body defense system (immune system). What are the signs or symptoms? Symptoms of this condition include:  A pale spot on the nail.  Thickening of the nail.  A nail that becomes yellow or brown.  A brittle or ragged nail edge.  A crumbling nail.  A nail that has lifted away from the nail bed. How is this diagnosed? This condition is diagnosed with a physical exam. Your health care provider may take a scraping or clipping from your nail to test for the fungus. How is this treated? Treatment is not needed for mild infections. If you have significant nail changes, treatment may include:  Antifungal medicines taken by mouth (orally). You may need to take the medicine for several weeks or several months, and you may not see the results for a long time. These medicines can cause side effects. Ask your health care provider what problems to watch for.  Antifungal nail polish or nail cream. These may be used along with oral antifungal medicines.  Laser treatment of the nail.  Surgery to remove the nail. This may be needed for the most severe infections.  It can take a long time, usually up to a year, for the infection to go away. The infection may also come back. Follow these instructions at home: Medicines  Take or apply over-the-counter and prescription medicines only as told by your health care provider.  Ask your health care provider about using over-the-counter mentholated ointment on your nails. Nail care  Trim your nails often.  Wash and dry your hands and feet every day.  Keep your feet dry: ? Wear absorbent socks, and change your socks frequently. ? Wear shoes that allow air to circulate, such as sandals or canvas tennis shoes. Throw out old shoes.  Do not use artificial nails.  If you go to a nail salon, make sure you choose one that uses clean instruments.  Use antifungal foot powder on your feet and in your shoes.  General instructions  Do not share personal items, such as towels or nail clippers.  Do not walk barefoot in shower rooms or locker rooms.  Wear rubber gloves if you are working with your hands in wet areas.  Keep all follow-up visits as told by your health care provider. This is important. Contact a health care provider if: Your infection is not getting better or it is getting worse after several months. Summary  A fungal nail infection is a common infection of the toenails or fingernails.  Treatment is not needed for mild infections. If you have significant nail changes, treatment may include taking medicine orally and applying medicine to your nails.  It can take a long time, usually up to a year, for the infection to go away. The infection may also come back.  Take or apply over-the-counter and prescription medicines only as told by your health care provider.  Follow instructions for taking care of your nails to help prevent infection from coming back or spreading. This information is not intended to replace advice given to you by your health care provider. Make sure you discuss any questions you have with your health care provider. Document Released: 03/05/2000 Document Revised: 06/29/2018 Document Reviewed: 08/12/2017 Elsevier Patient Education  2020 [employer].

## 2018-11-14 NOTE — Telephone Encounter (Signed)
Appt scheduled for today at 4:20pm.

## 2018-11-14 NOTE — Telephone Encounter (Signed)
Would recommend switching to topical lamisil at this point twice daily for 3 weeks. The tablets can be hard on the liver. If she has questions, please schedule virtual visit with me. Thanks!

## 2018-11-14 NOTE — Progress Notes (Signed)
Virtual Visit via Video Note  I connected with Carmen Knight  on 11/14/18 at  4:20 PM EDT by a video enabled telemedicine application and verified that I am speaking with the correct person using two identifiers.  Location patient: home Location provider:work or home office Persons participating in the virtual visit: patient, provider  I discussed the limitations of evaluation and management by telemedicine and the availability of in person appointments. The patient expressed understanding and agreed to proceed.   HPI:  Acute visit for a bad case of tinea pedis: -on the feet -started about 2-3 months ago -was on the R foot with itchy peeling skin on the plantar aspect, and then got a discolored and friable 5th toenail  -she tried foot soaks and Lamisil topical and vinegar - nothing worked -she did an evisit recently and was given oral terbinafine for 1 week - it has improved dramatically, but still has some peeling skin and the toenail changes -she wishes to extend the treatment -denies skin issues elsewhere or other concerns     ROS: See pertinent positives and negatives per HPI.  Past Medical History:  Diagnosis Date  . Sleep apnea     Past Surgical History:  Procedure Laterality Date  . ABDOMINAL HYSTERECTOMY    . FOOT SURGERY      Family History  Problem Relation Age of Onset  . Prostate cancer Father   . Stomach cancer Mother        passed away with stomach cancer     SOCIAL HX: see hpi   Current Outpatient Medications:  .  albuterol (PROVENTIL HFA;VENTOLIN HFA) 108 (90 Base) MCG/ACT inhaler, Take 2 puffs by mouth every 6 hours as needed, Disp: 8.5 Inhaler, Rfl: 0 .  fexofenadine (ALLEGRA) 180 MG tablet, Take 180 mg by mouth as needed for allergies or rhinitis. Reported on 07/02/2015, Disp: , Rfl:   Current Facility-Administered Medications:  .  0.9 %  sodium chloride infusion, 500 mL, Intravenous, Continuous, Rachael FeeJacobs, Daniel P, MD  EXAM:  VITALS per patient if  applicable:  GENERAL: alert, oriented, appears well and in no acute distress  HEENT: atraumatic, conjunttiva clear, no obvious abnormalities on inspection of external nose and ears  NECK: normal movements of the head and neck  LUNGS: on inspection no signs of respiratory distress, breathing rate appears normal, no obvious gross SOB, gasping or wheezing  CV: no obvious cyanosis  MS: moves all visible extremities without noticeable abnormality  SKIN: some peeling of the skin on the plantar aspect of the R foot, thickened and friable appearing 5th toenail on the R foot  PSYCH/NEURO: pleasant and cooperative, no obvious depression or anxiety, speech and thought processing grossly intact  ASSESSMENT AND PLAN:  Discussed the following assessment and plan:  Tinea pedis of right foot  Onychomycosis - Plan: Comprehensive metabolic panel  Medication course lengthened - Plan: Comprehensive metabolic panel  Will extend course of terbinafine to 1-3 months pending labs and response per her preference. Follow up in 4-6 weeks. Discussed other potential etiologies, options for evaluation, risks, treatment options and treatment side effects. Suspect fungal given appearance and response to treatment. Advised dermatology evaluation if not resolving with trearment.  Sent message to admin to schedule lab visit and follow up.  I discussed the assessment and treatment plan with the patient. The patient was provided an opportunity to ask questions and all were answered. The patient agreed with the plan and demonstrated an understanding of the instructions.   The patient was  advised to call back or seek an in-person evaluation if the symptoms worsen or if the condition fails to improve as anticipated.   Carmen Kern, DO   Patient Instructions  -get the labs  -once lab results return will send in the medication  -please follow up about 4-6 weeks after starting the medication  Terbinafine tablets  What is this medicine? TERBINAFINE (TER bin a feen) is an antifungal medicine. It is used to treat certain kinds of fungal or yeast infections. This medicine may be used for other purposes; ask your health care provider or pharmacist if you have questions. COMMON BRAND NAME(S): Lamisil, Terbinex What should I tell my health care provider before I take this medicine? They need to know if you have any of these conditions:  drink alcoholic beverages  kidney disease  liver disease  an unusual or allergic reaction to terbinafine, other medicines, foods, dyes, or preservatives  pregnant or trying to get pregnant  breast-feeding How should I use this medicine? Take this medicine by mouth with a full glass of water. Follow the directions on the prescription label. You can take this medicine with food or on an empty stomach. Take your medicine at regular intervals. Do not take your medicine more often than directed. Do not skip doses or stop your medicine early even if you feel better. Do not stop taking except on your doctor's advice. Talk to your pediatrician regarding the use of this medicine in children. Special care may be needed. Overdosage: If you think you have taken too much of this medicine contact a poison control center or emergency room at once. NOTE: This medicine is only for you. Do not share this medicine with others. What if I miss a dose? If you miss a dose, take it as soon as you can. If it is almost time for your next dose, take only that dose. Do not take double or extra doses. What may interact with this medicine? Do not take this medicine with any of the following medications:  thioridazine This medicine may also interact with the following medications:  beta-blockers  caffeine  cimetidine  cyclosporine  medicines for depression, anxiety, or psychotic disturbances  medicines for fungal infections like fluconazole and ketoconazole  medicines for irregular  heartbeat like amiodarone, flecainide and propafenone  rifampin  warfarin This list may not describe all possible interactions. Give your health care provider a list of all the medicines, herbs, non-prescription drugs, or dietary supplements you use. Also tell them if you smoke, drink alcohol, or use illegal drugs. Some items may interact with your medicine. What should I watch for while using this medicine? Visit your doctor or health care provider regularly. Tell your doctor right away if you have nausea or vomiting, loss of appetite, stomach pain on your right upper side, yellow skin, dark urine, light stools, or are over tired. Some fungal infections need many weeks or months of treatment to cure. If you are taking this medicine for a long time, you will need to have important blood work done. This medicine may cause serious skin reactions. They can happen weeks to months after starting the medicine. Contact your health care provider right away if you notice fevers or flu-like symptoms with a rash. The rash may be red or purple and then turn into blisters or peeling of the skin. Or, you might notice a red rash with swelling of the face, lips or lymph nodes in your neck or under your arms. What  side effects may I notice from receiving this medicine? Side effects that you should report to your doctor or health care professional as soon as possible:  allergic reactions like skin rash or hives, swelling of the face, lips, or tongue  changes in vision  dark urine  fever or infection  general ill feeling or flu-like symptoms  light-colored stools  loss of appetite, nausea  rash, fever, and swollen lymph nodes  redness, blistering, peeling or loosening of the skin, including inside the mouth  right upper belly pain  unusually weak or tired  yellowing of the eyes or skin Side effects that usually do not require medical attention (report to your doctor or health care professional if  they continue or are bothersome):  changes in taste  diarrhea  hair loss  muscle or joint pain  stomach gas  stomach upset This list may not describe all possible side effects. Call your doctor for medical advice about side effects. You may report side effects to FDA at 1-800-FDA-1088. Where should I keep my medicine? Keep out of the reach of children. Store at room temperature below 25 degrees C (77 degrees F). Protect from light. Throw away any unused medicine after the expiration date. NOTE: This sheet is a summary. It may not cover all possible information. If you have questions about this medicine, talk to your doctor, pharmacist, or health care provider.  2020 Elsevier/Gold Standard (2018-06-16 15:37:07)    Fungal Nail Infection A fungal nail infection is a common infection of the toenails or fingernails. This condition affects toenails more often than fingernails. It often affects the great, or big, toes. More than one nail may be infected. The condition can be passed from person to person (is contagious). What are the causes? This condition is caused by a fungus. Several types of fungi can cause the infection. These fungi are common in moist and warm areas. If your hands or feet come into contact with the fungus, it may get into a crack in your fingernail or toenail and cause the infection. What increases the risk? The following factors may make you more likely to develop this condition:  Being female.  Being of older age.  Living with someone who has the fungus.  Walking barefoot in areas where the fungus thrives, such as showers or locker rooms.  Wearing shoes and socks that cause your feet to sweat.  Having a nail injury or a recent nail surgery.  Having certain medical conditions, such as: ? Athlete's foot. ? Diabetes. ? Psoriasis. ? Poor circulation. ? A weak body defense system (immune system). What are the signs or symptoms? Symptoms of this condition  include:  A pale spot on the nail.  Thickening of the nail.  A nail that becomes yellow or brown.  A brittle or ragged nail edge.  A crumbling nail.  A nail that has lifted away from the nail bed. How is this diagnosed? This condition is diagnosed with a physical exam. Your health care provider may take a scraping or clipping from your nail to test for the fungus. How is this treated? Treatment is not needed for mild infections. If you have significant nail changes, treatment may include:  Antifungal medicines taken by mouth (orally). You may need to take the medicine for several weeks or several months, and you may not see the results for a long time. These medicines can cause side effects. Ask your health care provider what problems to watch for.  Antifungal nail  polish or nail cream. These may be used along with oral antifungal medicines.  Laser treatment of the nail.  Surgery to remove the nail. This may be needed for the most severe infections. It can take a long time, usually up to a year, for the infection to go away. The infection may also come back. Follow these instructions at home: Medicines  Take or apply over-the-counter and prescription medicines only as told by your health care provider.  Ask your health care provider about using over-the-counter mentholated ointment on your nails. Nail care  Trim your nails often.  Wash and dry your hands and feet every day.  Keep your feet dry: ? Wear absorbent socks, and change your socks frequently. ? Wear shoes that allow air to circulate, such as sandals or canvas tennis shoes. Throw out old shoes.  Do not use artificial nails.  If you go to a nail salon, make sure you choose one that uses clean instruments.  Use antifungal foot powder on your feet and in your shoes. General instructions  Do not share personal items, such as towels or nail clippers.  Do not walk barefoot in shower rooms or locker rooms.  Wear  rubber gloves if you are working with your hands in wet areas.  Keep all follow-up visits as told by your health care provider. This is important. Contact a health care provider if: Your infection is not getting better or it is getting worse after several months. Summary  A fungal nail infection is a common infection of the toenails or fingernails.  Treatment is not needed for mild infections. If you have significant nail changes, treatment may include taking medicine orally and applying medicine to your nails.  It can take a long time, usually up to a year, for the infection to go away. The infection may also come back.  Take or apply over-the-counter and prescription medicines only as told by your health care provider.  Follow instructions for taking care of your nails to help prevent infection from coming back or spreading. This information is not intended to replace advice given to you by your health care provider. Make sure you discuss any questions you have with your health care provider. Document Released: 03/05/2000 Document Revised: 06/29/2018 Document Reviewed: 08/12/2017 Elsevier Patient Education  2020 [employer]

## 2018-11-15 ENCOUNTER — Telehealth: Payer: Self-pay | Admitting: Family Medicine

## 2018-11-15 NOTE — Telephone Encounter (Signed)
-----   Message from Lucretia Kern, DO sent at 11/14/2018  4:33 PM EDT ----- Please schedule her for a lab visit on Friday of this week or early next week.  Please schedule her a follow up with me in 5 weeks for Tinea Pedis.  Thanks!

## 2018-11-15 NOTE — Telephone Encounter (Signed)
Called pt and made a lab and f/up appts//tes

## 2018-11-17 ENCOUNTER — Other Ambulatory Visit: Payer: Self-pay

## 2018-11-17 ENCOUNTER — Other Ambulatory Visit (INDEPENDENT_AMBULATORY_CARE_PROVIDER_SITE_OTHER): Payer: 59

## 2018-11-17 DIAGNOSIS — Z79899 Other long term (current) drug therapy: Secondary | ICD-10-CM

## 2018-11-17 DIAGNOSIS — B351 Tinea unguium: Secondary | ICD-10-CM | POA: Diagnosis not present

## 2018-11-17 LAB — COMPREHENSIVE METABOLIC PANEL
ALT: 13 U/L (ref 0–35)
AST: 14 U/L (ref 0–37)
Albumin: 4.4 g/dL (ref 3.5–5.2)
Alkaline Phosphatase: 90 U/L (ref 39–117)
BUN: 14 mg/dL (ref 6–23)
CO2: 28 mEq/L (ref 19–32)
Calcium: 9.6 mg/dL (ref 8.4–10.5)
Chloride: 102 mEq/L (ref 96–112)
Creatinine, Ser: 0.69 mg/dL (ref 0.40–1.20)
GFR: 88.66 mL/min (ref >60.00)
Glucose, Bld: 104 mg/dL — ABNORMAL HIGH (ref 70–99)
Potassium: 4.5 mEq/L (ref 3.5–5.1)
Sodium: 139 mEq/L (ref 135–145)
Total Bilirubin: 0.4 mg/dL (ref 0.2–1.2)
Total Protein: 6.9 g/dL (ref 6.0–8.3)

## 2018-11-21 ENCOUNTER — Other Ambulatory Visit: Payer: Self-pay | Admitting: Family Medicine

## 2018-11-21 MED ORDER — TERBINAFINE HCL 250 MG PO TABS: 250.0000 mg | ORAL_TABLET | Freq: Every day | ORAL | 1 refills | Status: DC

## 2018-11-28 ENCOUNTER — Other Ambulatory Visit: Payer: Self-pay

## 2018-11-28 ENCOUNTER — Other Ambulatory Visit: Payer: Self-pay | Admitting: Family Medicine

## 2018-11-28 ENCOUNTER — Ambulatory Visit
Admission: RE | Admit: 2018-11-28 | Discharge: 2018-11-28 | Disposition: A | Discharge status: Home / Self Care | Payer: 59 | Source: Ambulatory Visit

## 2018-11-28 DIAGNOSIS — Z1231 Encounter for screening mammogram for malignant neoplasm of breast: Secondary | ICD-10-CM

## 2018-11-28 DIAGNOSIS — N63 Unspecified lump in unspecified breast: Secondary | ICD-10-CM

## 2018-11-28 DIAGNOSIS — R921 Mammographic calcification found on diagnostic imaging of breast: Secondary | ICD-10-CM

## 2018-12-07 ENCOUNTER — Other Ambulatory Visit: Payer: 59

## 2018-12-08 ENCOUNTER — Other Ambulatory Visit: Payer: Self-pay | Admitting: Family Medicine

## 2018-12-08 ENCOUNTER — Ambulatory Visit
Admission: RE | Admit: 2018-12-08 | Discharge: 2018-12-08 | Disposition: A | Discharge status: Home / Self Care | Payer: 59 | Source: Ambulatory Visit | Attending: Family Medicine | Admitting: Family Medicine

## 2018-12-08 ENCOUNTER — Other Ambulatory Visit: Payer: Self-pay

## 2018-12-08 DIAGNOSIS — R921 Mammographic calcification found on diagnostic imaging of breast: Secondary | ICD-10-CM

## 2018-12-08 DIAGNOSIS — N63 Unspecified lump in unspecified breast: Secondary | ICD-10-CM

## 2018-12-08 DIAGNOSIS — N6310 Unspecified lump in the right breast, unspecified quadrant: Secondary | ICD-10-CM

## 2018-12-08 IMAGING — MG MM DIGITAL DIAGNOSTIC BILAT W/ TOMO W/ CAD
6 of 10 series · 6 of 26 positions shown · non-contrast
Comparison: Previous exam(s).

CLINICAL DATA: Patient was recalled from screening in [DATE]
with diagnostic workup revealing a left breast mass and
calcifications for which 6 month follow-up was recommended. She
presents today for follow-up and routine exam.

EXAM:
DIGITAL DIAGNOSTIC BILATERAL MAMMOGRAM WITH CAD AND TOMO
ULTRASOUND BILATERAL BREAST

[L CC]
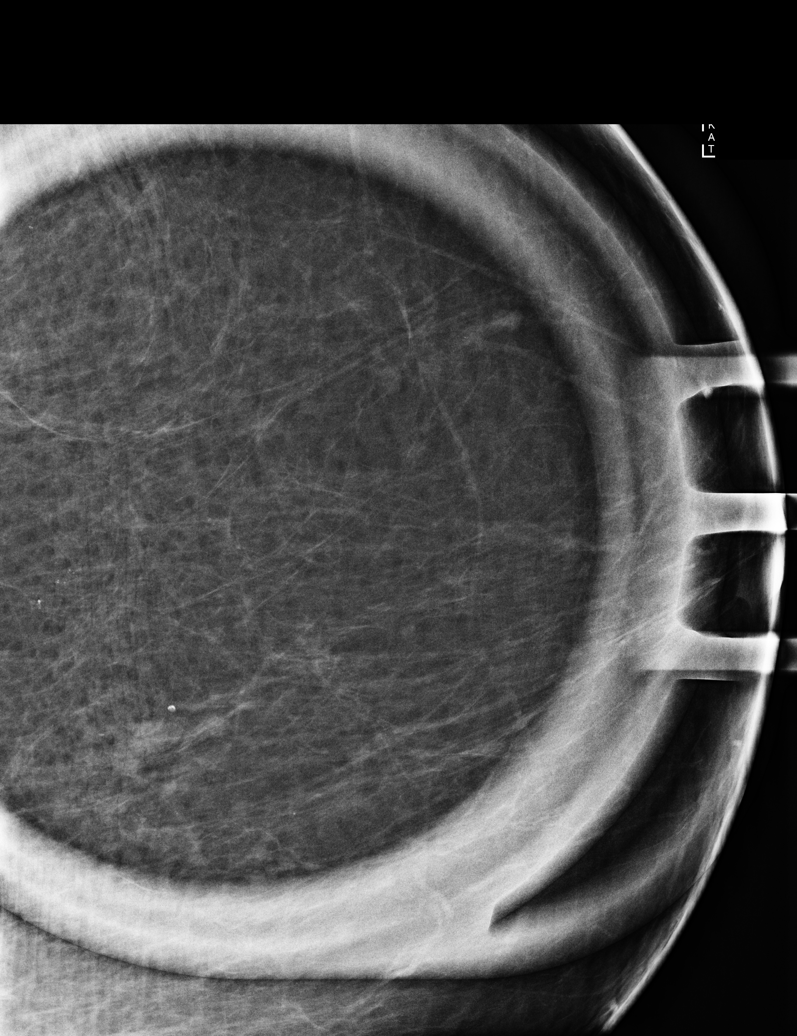

[L ML]
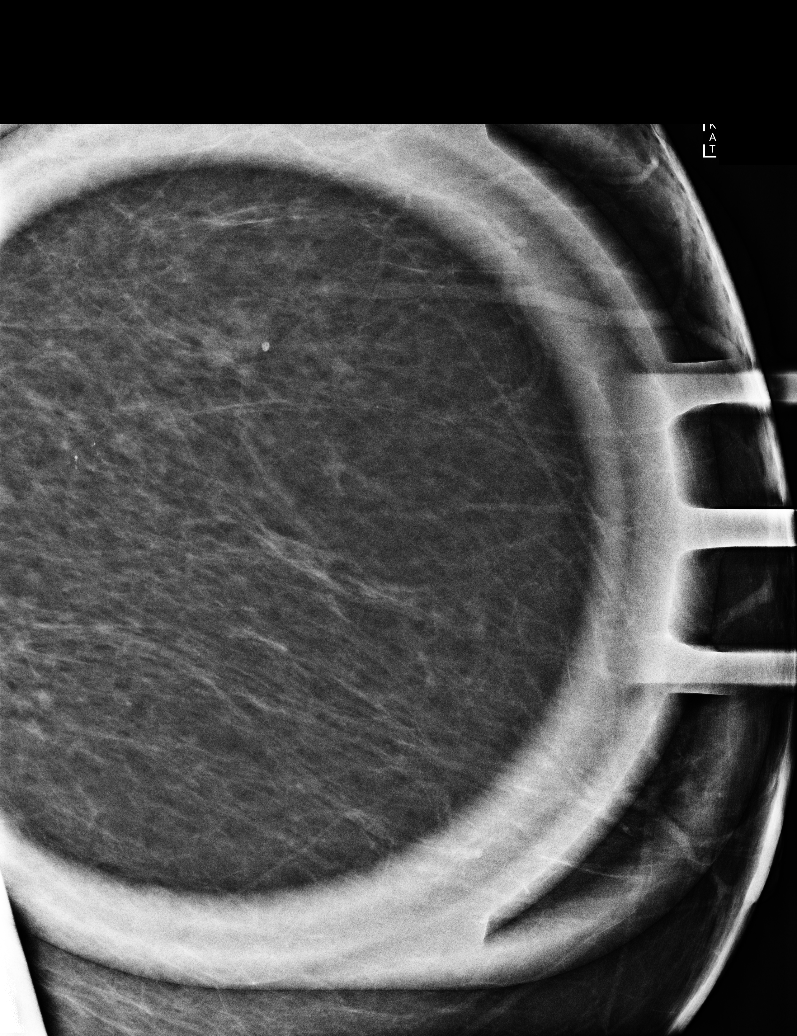

[R MLO synth-2D]
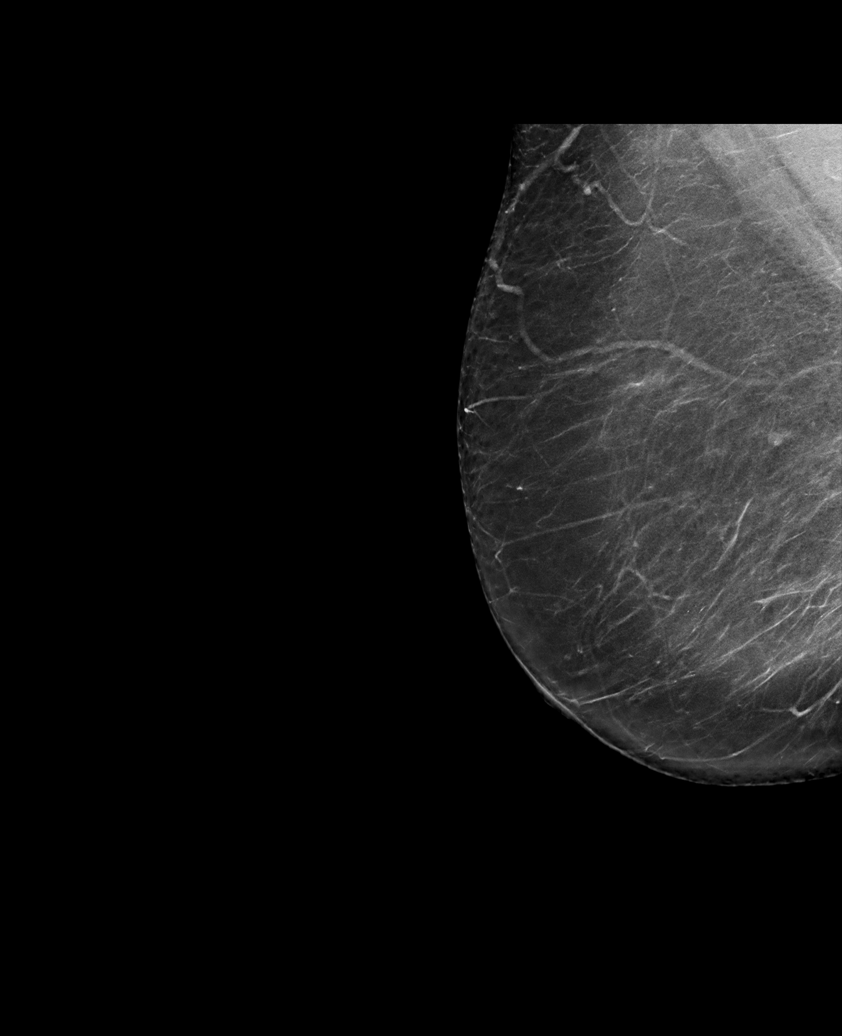

[L MLO synth-2D]
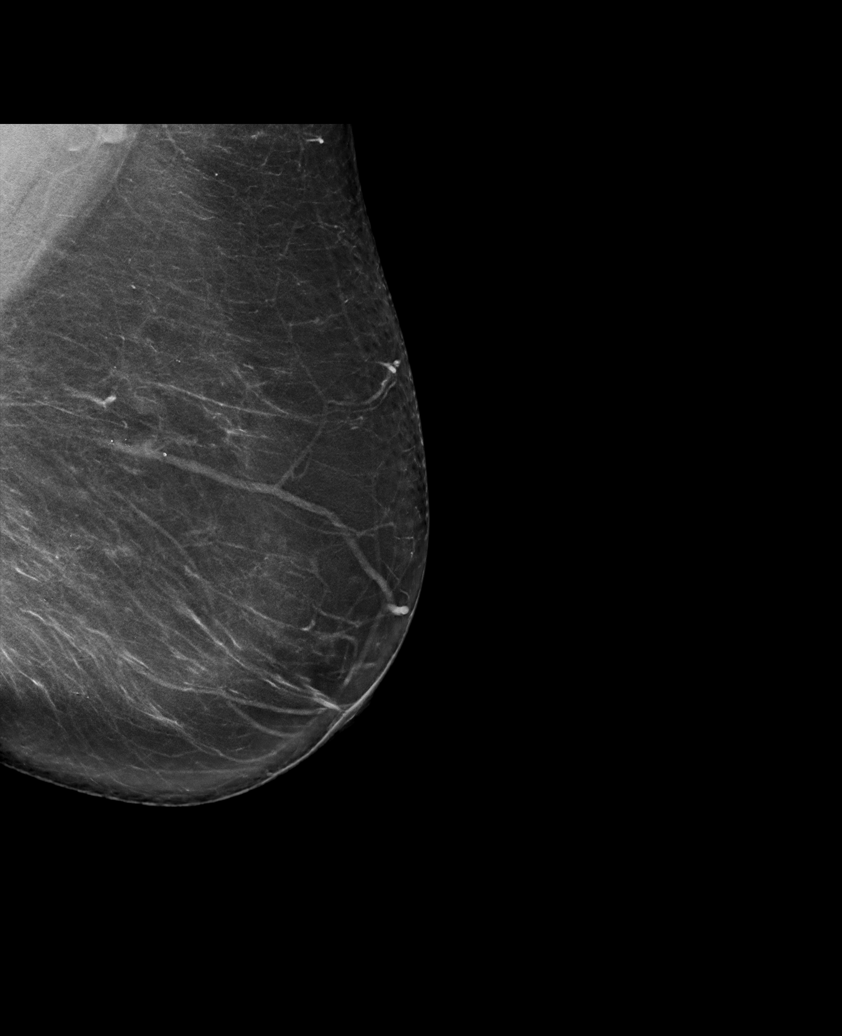

[L CC synth-2D]
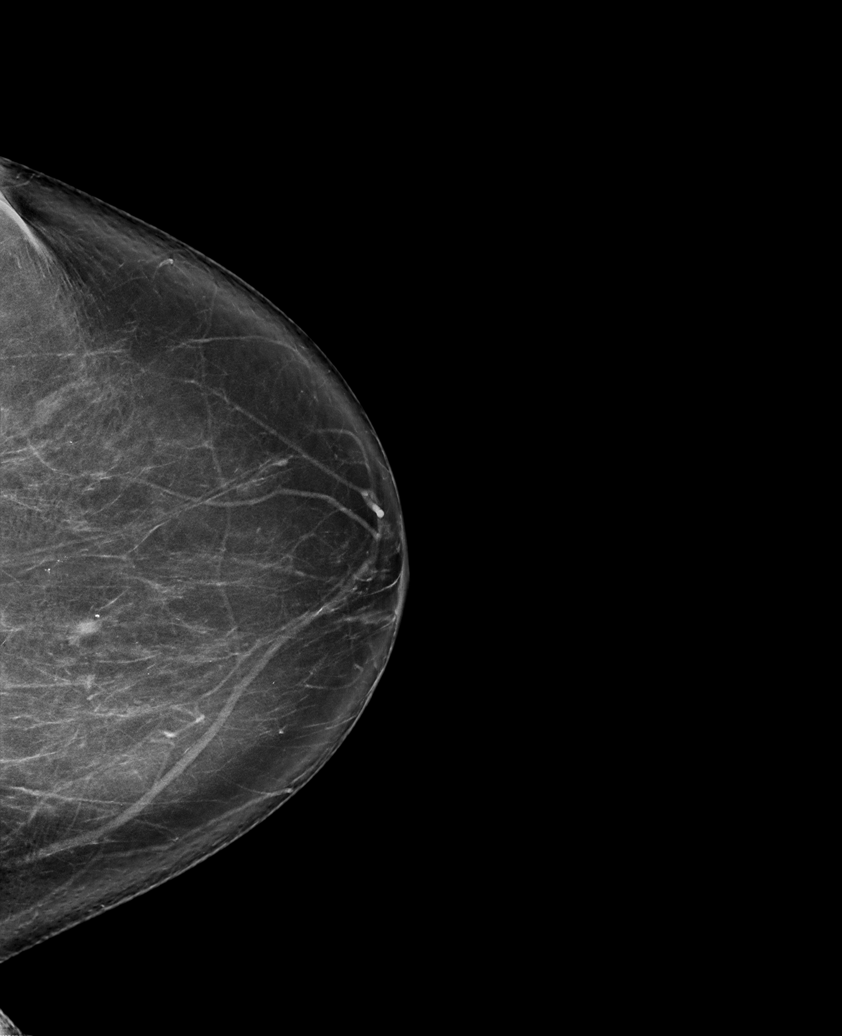

[R CC synth-2D]
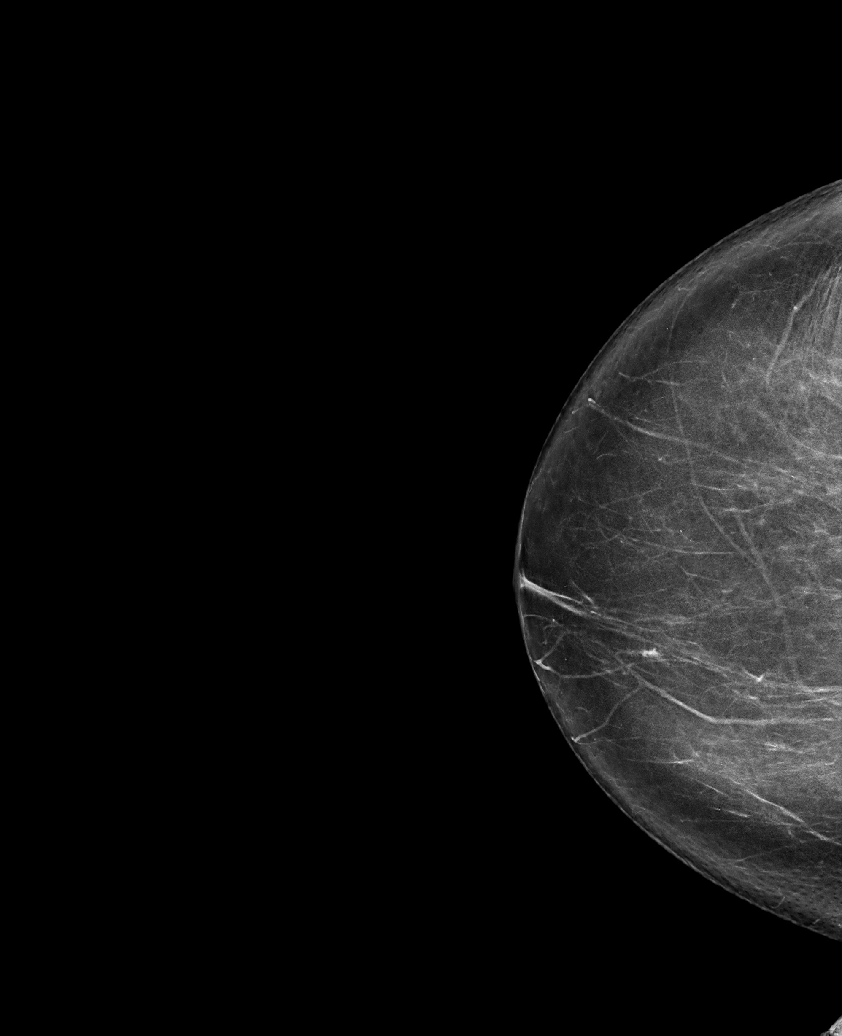

[6 of 26 positions shown; findings below may reference images not displayed]

ACR Breast Density Category b: There are scattered areas of
fibroglandular density.
FINDINGS: Mammogram:

In the right breast there is a 3 mm oval mass at 12 o'clock
posterior depth.

In the left breast the previously described grouped calcifications
in the central slightly superior left breast are stable.

In the left breast the previously described mass in the central
slightly medial breast posterior depth is stable compared to the
prior study measuring approximately 9 mm.

Mammographic images were processed with CAD.

Targeted ultrasound is performed in the right breast at 12 o'clock 3
cm from the nipple demonstrating an oval circumscribed hypoechoic
mass with internal echoes measuring 0.4 x 0.3 x 0.3 cm. This
corresponds to the mammographic abnormality.

Targeted ultrasound is performed of the left breast at 11 o'clock 3
cm from the nipple demonstrating an oval hypoechoic mass with
internal septation measuring 0.9 x 0.4 x 0.4 cm, previously
measuring 0.5 x 0.3 x 0.5 cm. The increase in size measurements on
ultrasound appears to be technique related as the mammographic
appearance is stable.
IMPRESSION: 1. RIGHT breast mass measuring 0.4 cm at 12 o'clock is probably
benign and likely represents a cyst.

2. The previously described LEFT breast calcifications and LEFT
breast mass at 11 o'clock are stable for over 2 years, consistent
with benign findings.

RECOMMENDATION:
Diagnostic RIGHT breast ultrasound is recommended in 6 months for
the probably benign mass at 12 o'clock.

I have discussed the findings and recommendations with the patient.
If applicable, a reminder letter will be sent to the patient
regarding the next appointment.

BI-RADS CATEGORY  3: Probably benign.

## 2018-12-21 ENCOUNTER — Telehealth: Payer: 59 | Admitting: Family Medicine

## 2019-01-02 ENCOUNTER — Encounter: Payer: Self-pay | Admitting: Family Medicine

## 2019-01-02 NOTE — Telephone Encounter (Signed)
Scheduled patient with Dr. Martinique for an in office appointment tomorrow afternoon.

## 2019-01-03 ENCOUNTER — Other Ambulatory Visit: Payer: Self-pay

## 2019-01-03 ENCOUNTER — Ambulatory Visit (INDEPENDENT_AMBULATORY_CARE_PROVIDER_SITE_OTHER): Payer: 59 | Admitting: Family Medicine

## 2019-01-03 ENCOUNTER — Encounter: Payer: Self-pay | Admitting: Family Medicine

## 2019-01-03 VITALS — BP 112/76 | HR 91 | Temp 98.6°F | Resp 12 | Ht 67.25 in | Wt 219.4 lb

## 2019-01-03 DIAGNOSIS — E669 Obesity, unspecified: Secondary | ICD-10-CM | POA: Diagnosis not present

## 2019-01-03 DIAGNOSIS — Z23 Encounter for immunization: Secondary | ICD-10-CM | POA: Diagnosis not present

## 2019-01-03 DIAGNOSIS — R9431 Abnormal electrocardiogram [ECG] [EKG]: Secondary | ICD-10-CM | POA: Diagnosis not present

## 2019-01-03 DIAGNOSIS — E559 Vitamin D deficiency, unspecified: Secondary | ICD-10-CM | POA: Diagnosis not present

## 2019-01-03 DIAGNOSIS — E785 Hyperlipidemia, unspecified: Secondary | ICD-10-CM

## 2019-01-03 NOTE — Patient Instructions (Signed)
A few things to remember from today's visit:   Need for influenza vaccination - Plan: Flu Vaccine QUAD 36+ mos IM  Abnormal EKG  Vitamin D deficiency, unspecified  Hyperlipidemia, unspecified hyperlipidemia type  EKG done today has similar changes that the one done in 2014.  If you have any chest pain, palpitation, shortness of breath, or feeling clammy with exercise you need to seek immediate medical attention.  If you decide to go to cardiologist, please let me know.

## 2019-01-03 NOTE — Progress Notes (Signed)
HPI:   [name] is a 53 y.o. female, who is here today to establish care.  Former PCP: Dr Maudie Mercury Last preventive routine visit: 08/01/2017.  Chronic medical problems: Obesity,HLD.  HLD  currently she is on nonpharmacologic treatment. She had fasting lipid panel recently at [facility], weight loss clinic. On 12/29/2018 TC 209, TG 104, HDL 57, and LDL 131.  Vitamin D deficiency: 4 OH vitamin D was low at 21.8, currently she is on vitamin D supplementation 4000 units daily.  CMP and CBC in normal range. HgA1C 5.4.  Concerns today: Abnormal EKG. As part of the work-up to start weight loss program she had an EKG done, she was referred to PCP. She brought copy of EKG.  She has not started medications for wt loss. Testosterone and progesterone were also measure as part of "female combo panel", 0.1 and 15 respectively. Testosterone 137.5 mg was recommended (wrote on copy of lab results). She has not started any medication at this time.  She denies any CP,dyspnea,palpitations,dizziness,or diaphoresis with exertion. 15 years ago she had cardiac work up because fluttery sensation, reported as negative. She has not had problem is years. No Hx of tobacco use,DM,or HTN. Mild HLD. FHx negative for CAD.  Review of Systems  Constitutional: Negative for activity change, appetite change, fatigue, fever and unexpected weight change.  HENT: Negative for mouth sores and nosebleeds.   Eyes: Negative for redness and visual disturbance.  Respiratory: Negative for cough and wheezing.   Cardiovascular: Negative for leg swelling.  Gastrointestinal: Negative for abdominal pain, nausea and vomiting.       Negative for changes in bowel habits.  Genitourinary: Negative for decreased urine volume and hematuria.  Neurological: Negative for syncope, weakness and headaches.  Psychiatric/Behavioral: Negative for confusion. The patient is not nervous/anxious.   Rest see pertinent positives  and negatives per HPI.   Current Outpatient Medications on File Prior to Visit  Medication Sig Dispense Refill  . albuterol (PROVENTIL HFA;VENTOLIN HFA) 108 (90 Base) MCG/ACT inhaler Take 2 puffs by mouth every 6 hours as needed 8.5 Inhaler 0  . fexofenadine (ALLEGRA) 180 MG tablet Take 180 mg by mouth as needed for allergies or rhinitis. Reported on 07/02/2015    . Testosterone 100 MG PLLT      Current Facility-Administered Medications on File Prior to Visit  Medication Dose Route Frequency Provider Last Rate Last Dose  . 0.9 %  sodium chloride infusion  500 mL Intravenous Continuous Milus Banister, MD         Past Medical History:  Diagnosis Date  . Sleep apnea    No Known Allergies  Family History  Problem Relation Age of Onset  . Prostate cancer Father   . Stomach cancer Mother        passed away with stomach cancer     Social History   Socioeconomic History  . Marital status: Married    Spouse name: Not on file  . Number of children: Not on file  . Years of education: Not on file  . Highest education level: Not on file  Occupational History  . Not on file  Social Needs  . Financial resource strain: Not on file  . Food insecurity    Worry: Not on file    Inability: Not on file  . Transportation needs    Medical: Not on file    Non-medical: Not on file  Tobacco Use  . Smoking status: Never Smoker  .  Smokeless tobacco: Never Used  Substance and Sexual Activity  . Alcohol use: Yes    Alcohol/week: 2.0 standard drinks    Types: 2 Glasses of wine per week  . Drug use: No  . Sexual activity: Not on file  Lifestyle  . Physical activity    Days per week: Not on file    Minutes per session: Not on file  . Stress: Not on file  Relationships  . Social Musician on phone: Not on file    Gets together: Not on file    Attends religious service: Not on file    Active member of club or organization: Not on file    Attends meetings of clubs or  organizations: Not on file    Relationship status: Not on file  Other Topics Concern  . Not on file  Social History Narrative  . Not on file   Vitals:   01/03/19 1433  BP: 112/76  Pulse: 91  Resp: 12  Temp: 98.6 F (37 C)  SpO2: 98%   Body mass index is 34.1 kg/m.   Physical Exam  Nursing note and vitals reviewed. Constitutional: She is oriented to person, place, and time. She appears well-developed. No distress.  HENT:  Head: Normocephalic and atraumatic.  Mouth/Throat: Oropharynx is clear and moist and mucous membranes are normal.  Eyes: Pupils are equal, round, and reactive to light. Conjunctivae are normal.  Cardiovascular: Normal rate and regular rhythm.  No murmur heard. Pulses:      Dorsalis pedis pulses are 2+ on the right side and 2+ on the left side.  Respiratory: Effort normal and breath sounds normal. No respiratory distress.  GI: Soft. She exhibits no mass. There is no hepatomegaly. There is no abdominal tenderness.  Musculoskeletal:        General: No edema.  Lymphadenopathy:    She has no cervical adenopathy.  Neurological: She is alert and oriented to person, place, and time. She has normal strength. No cranial nerve deficit. Gait normal.  Skin: Skin is warm. No rash noted. No erythema.  Psychiatric: She has a normal mood and affect.  Well groomed, good eye contact.    ASSESSMENT AND PLAN:  [name] was seen today for transfer of care.  Diagnoses and all orders for this visit:  Abnormal EKG EKG done on 12/30/2018: SR,LAD,normal intervals. Unspecific IVCD, unspecific T wave abnormalities in anterior and inferior leads. Asymptomatic.  There is an EKG done in 01/2013 with similar abnormalities. We discussed the probability of CAD, which is low but explained that it is never zero. She wants to hold on stress test for now. She was clearly instructed about warning signs.  Need for influenza vaccination -     Flu Vaccine QUAD 36+ mos IM  Vitamin  D deficiency, unspecified She just started Vit D 4000 U daily. Following with provider at wt loss clinic.  Hyperlipidemia, unspecified hyperlipidemia type Mild. Continue non pharmacologic treatment. It can be re-checked in 12/2019.  Obesity, Class I, BMI 30-34.9 We discussed benefits of wt loss as well as adverse effects of obesity. Consistency with healthy diet and physical activity recommended.  Cautioned in regard to testosterone replacement. Discussed side effects of testosterone including CV risk.   Return in about 6 months (around 07/04/2019).    Pieper Kasik G. [place], MD  [facility]: [facility].

## 2019-01-18 ENCOUNTER — Encounter: Payer: Self-pay | Admitting: Family Medicine

## 2019-01-29 ENCOUNTER — Other Ambulatory Visit: Payer: Self-pay | Admitting: Family Medicine

## 2019-01-29 DIAGNOSIS — R002 Palpitations: Secondary | ICD-10-CM

## 2019-01-29 DIAGNOSIS — R9431 Abnormal electrocardiogram [ECG] [EKG]: Secondary | ICD-10-CM

## 2019-03-04 NOTE — Progress Notes (Signed)
Cardiology Office Note:    Date:  03/05/2019   ID:  Carmen Knight, DOB [birth year], MRN [id number]  PCP:  [place], Betty G, MD  Cardiologist:  No primary care provider on file.  Electrophysiologist:  None   Referring MD: [place], Betty G, MD   Chief Complaint  Patient presents with  . Abnormal ECG    History of Present Illness:    Carmen Knight is a 53 y.o. female with a hx of OSA on CPAP who is referred by Dr. [place] for evaluation of EKG abnormalities.  She reports she was going to a weight loss program and they did an EKG and told her it was abnormal.  She reports that she has not been exercising recently due to plantar fasciitis for which she is in physical therapy.  Prior to that she was exercising regularly, would walk 3 to 4 miles a day and do yoga.  She denies any exertional chest pain or dyspnea.  She reports occasional palpitations which she describes as a fluttering in her chest that lasts 1 to 2 seconds and resolves.  Occurs about once per month.  Never smoker.  No known history of heart disease in her immediate family.   Past Medical History:  Diagnosis Date  . Sleep apnea     Past Surgical History:  Procedure Laterality Date  . ABDOMINAL HYSTERECTOMY    . FOOT SURGERY      Current Medications: Current Meds  Medication Sig  . albuterol (PROVENTIL HFA;VENTOLIN HFA) 108 (90 Base) MCG/ACT inhaler Take 2 puffs by mouth every 6 hours as needed  . fexofenadine (ALLEGRA) 180 MG tablet Take 180 mg by mouth as needed for allergies or rhinitis. Reported on 07/02/2015  . Testosterone 100 MG PLLT    Current Facility-Administered Medications for the 03/05/19 encounter (Office Visit) with Little Ishikawa, MD  Medication  . 0.9 %  sodium chloride infusion     Allergies:   Patient has no known allergies.   Social History   Socioeconomic History  . Marital status: Married    Spouse name: Not on file  . Number of children: Not on file  . Years of education: Not on  file  . Highest education level: Not on file  Occupational History  . Not on file  Tobacco Use  . Smoking status: Never Smoker  . Smokeless tobacco: Never Used  Substance and Sexual Activity  . Alcohol use: Yes    Alcohol/week: 2.0 standard drinks    Types: 2 Glasses of wine per week  . Drug use: No  . Sexual activity: Not on file  Other Topics Concern  . Not on file  Social History Narrative  . Not on file   Social Determinants of Health   Financial Resource Strain:   . Difficulty of Paying Living Expenses: Not on file  Food Insecurity:   . Worried About Programme researcher, broadcasting/film/video in the Last Year: Not on file  . Ran Out of Food in the Last Year: Not on file  Transportation Needs:   . Lack of Transportation (Medical): Not on file  . Lack of Transportation (Non-Medical): Not on file  Physical Activity:   . Days of Exercise per Week: Not on file  . Minutes of Exercise per Session: Not on file  Stress:   . Feeling of Stress : Not on file  Social Connections:   . Frequency of Communication with Friends and Family: Not on file  . Frequency of Social  Gatherings with Friends and Family: Not on file  . Attends Religious Services: Not on file  . Active Member of Clubs or Organizations: Not on file  . Attends BankerClub or Organization Meetings: Not on file  . Marital Status: Not on file     Family History: The patient's family history includes Prostate cancer in her father; Stomach cancer in her mother.  ROS:   Please see the history of present illness.     All other systems reviewed and are negative.  EKGs/Labs/Other Studies Reviewed:    The following studies were reviewed today:   EKG:  EKG is ordered today.  The ekg ordered today demonstrates normal sinus rhythm with sinus arrhythmia, poor R wave progression  Recent Labs: 11/17/2018: ALT 13; BUN 14; Creatinine, Ser 0.69; Potassium 4.5; Sodium 139  Recent Lipid Panel    Component Value Date/Time   CHOL 209 (H) 08/01/2017 1204    TRIG 145.0 04/02/2013 0852   HDL 47.80 08/01/2017 1204   CHOLHDL 5 04/02/2013 0852   VLDL 29.0 04/02/2013 0852   LDLCALC 115 (H) 04/02/2013 0852    Physical Exam:    VS:  BP 109/73   Pulse 60   Temp (!) 97.2 F (36.2 C)   Ht 5\' 8"  (1.727 m)   Wt 213 lb 3.2 oz (96.7 kg)   BMI 32.42 kg/m     Wt Readings from Last 3 Encounters:  03/05/19 213 lb 3.2 oz (96.7 kg)  01/03/19 219 lb 6 oz (99.5 kg)  06/05/18 218 lb 9.6 oz (99.2 kg)     GEN:  Well nourished, well developed in no acute distress HEENT: Normal NECK: No JVD; No carotid bruits LYMPHATICS: No lymphadenopathy CARDIAC: RRR, no murmurs, rubs, gallops RESPIRATORY:  Clear to auscultation without rales, wheezing or rhonchi  ABDOMEN: Soft, non-tender, non-distended MUSCULOSKELETAL:  No edema; No deformity  SKIN: Warm and dry NEUROLOGIC:  Alert and oriented x 3 PSYCHIATRIC:  Normal affect   ASSESSMENT:    1. Nonspecific abnormal electrocardiogram (ECG) (EKG)   2. Palpitations   3. OSA (obstructive sleep apnea)    PLAN:    In order of problems listed above:  EKG abnormalities: Poor R wave progression, which could be suggestive of prior anterior infarct.  No symptoms to suggest angina.  Will check TTE to evaluate for structural heart disease  Palpitations: Description consistent with likely PACs or PVCs, as described feeling like heart skips a beat, last for a second and resolves.  This only occurs about once per month, no further work-up at this time.  If occurring more frequently, can evaluate with Holter monitor to quantify burden of ectopy  OSA: Continue CPAP  RTC in 1 year  Medication Adjustments/Labs and Tests Ordered: Current medicines are reviewed at length with the patient today.  Concerns regarding medicines are outlined above.  Orders Placed This Encounter  Procedures  . ECHOCARDIOGRAM COMPLETE   No orders of the defined types were placed in this encounter.   Patient Instructions  Medication  Instructions:  Continue same medications   Lab Work: None ordered   Testing/Procedures: Schedule Echo  Follow-Up: At [facility], you and your health needs are our priority.  As part of our continuing mission to provide you with exceptional heart care, we have created designated Provider Care Teams.  These Care Teams include your primary Cardiologist (physician) and Advanced Practice Providers (APPs -  Physician Assistants and Nurse Practitioners) who all work together to provide you with the care you need,  when you need it.  Your next appointment:  1 year 02/2020  The format for your next appointment:  Office   Provider:  Dr.Camrie Stock     Signed, Donato Heinz, MD  03/05/2019 10:39 AM    [place]

## 2019-03-05 ENCOUNTER — Ambulatory Visit (INDEPENDENT_AMBULATORY_CARE_PROVIDER_SITE_OTHER): Payer: 59 | Admitting: Cardiology

## 2019-03-05 ENCOUNTER — Other Ambulatory Visit: Payer: Self-pay

## 2019-03-05 VITALS — BP 109/73 | HR 60 | Temp 97.2°F | Ht 68.0 in | Wt 213.2 lb

## 2019-03-05 DIAGNOSIS — R9431 Abnormal electrocardiogram [ECG] [EKG]: Secondary | ICD-10-CM | POA: Diagnosis not present

## 2019-03-05 DIAGNOSIS — G4733 Obstructive sleep apnea (adult) (pediatric): Secondary | ICD-10-CM | POA: Diagnosis not present

## 2019-03-05 DIAGNOSIS — R002 Palpitations: Secondary | ICD-10-CM

## 2019-03-05 NOTE — Patient Instructions (Signed)
Medication Instructions:  Continue same medications   Lab Work: None ordered   Testing/Procedures: Schedule Echo  Follow-Up: At [facility], you and your health needs are our priority.  As part of our continuing mission to provide you with exceptional heart care, we have created designated Provider Care Teams.  These Care Teams include your primary Cardiologist (physician) and Advanced Practice Providers (APPs -  Physician Assistants and Nurse Practitioners) who all work together to provide you with the care you need, when you need it.  Your next appointment:  1 year 02/2020  The format for your next appointment:  Office   Provider:  Dr.Schumann

## 2019-03-05 NOTE — Addendum Note (Signed)
Addended by: Hinton Dyer on: 03/05/2019 03:00 PM   Modules accepted: Orders

## 2019-03-06 ENCOUNTER — Ambulatory Visit (HOSPITAL_COMMUNITY): Payer: 59 | Attending: Cardiovascular Disease

## 2019-03-06 DIAGNOSIS — R9431 Abnormal electrocardiogram [ECG] [EKG]: Secondary | ICD-10-CM | POA: Diagnosis present

## 2019-05-07 NOTE — Progress Notes (Signed)
Cardiology Office Note:    Date:  05/08/2019   ID:  Carmen Knight, DOB 05-Nov-1965, MRN [id number]  PCP:  [place], Betty G, MD  Cardiologist:  No primary care provider on file.  Electrophysiologist:  None   Referring MD: [place], Betty G, MD   Chief Complaint  Patient presents with  . Palpitations    History of Present Illness:    Carmen Knight is a 54 y.o. female with a hx of OSA on CPAP who presents for follow-up.  She was initially referred after going to a weight loss program in which an EKG was done and found to be abnormal (poor R wave progression).  She reports that she has not been exercising recently due to plantar fasciitis for which she is in physical therapy.  Prior to that she was exercising regularly, would walk 3 to 4 miles a day and do yoga.  She denies any exertional chest pain or dyspnea.  She reports occasional palpitations which she describes as a fluttering in her chest that lasts 1 to 2 seconds and resolves.  Occurs about once per month.  Never smoker.  No known history of heart disease in her immediate family.  Given EKG abnormalities, TTE was done on 03/06/2019, which showed no significant abnormalities.  States that since her last clinic visit, she has been doing well.  Denies any palpitations in the last few months.  Reports palpitations will typically occur in bursts and will not have any for a few months.  Has not noticed any relationship with caffeine or alcohol.  Reports she is very compliant with her CPAP.  She denies any chest pain, dyspnea, lower extremity edema, lightheadedness, or syncope.  Exercise has been limited by plantar fasciitis, considering surgery.  She walks about 1 mile per day.  Can walk up 2 flights of stairs without stopping.  Also does push-ups every day.    Past Medical History:  Diagnosis Date  . Sleep apnea     Past Surgical History:  Procedure Laterality Date  . ABDOMINAL HYSTERECTOMY    . FOOT SURGERY      Current  Medications: Current Meds  Medication Sig  . albuterol (PROVENTIL HFA;VENTOLIN HFA) 108 (90 Base) MCG/ACT inhaler Take 2 puffs by mouth every 6 hours as needed  . fexofenadine (ALLEGRA) 180 MG tablet Take 180 mg by mouth as needed for allergies or rhinitis. Reported on 07/02/2015  . Testosterone 100 MG PLLT    Current Facility-Administered Medications for the 05/08/19 encounter (Office Visit) with Little Ishikawa, MD  Medication  . 0.9 %  sodium chloride infusion     Allergies:   Patient has no known allergies.   Social History   Socioeconomic History  . Marital status: Married    Spouse name: Not on file  . Number of children: Not on file  . Years of education: Not on file  . Highest education level: Not on file  Occupational History  . Not on file  Tobacco Use  . Smoking status: Never Smoker  . Smokeless tobacco: Never Used  Substance and Sexual Activity  . Alcohol use: Yes    Alcohol/week: 2.0 standard drinks    Types: 2 Glasses of wine per week  . Drug use: No  . Sexual activity: Not on file  Other Topics Concern  . Not on file  Social History Narrative  . Not on file   Social Determinants of Health   Financial Resource Strain:   . Difficulty of  Paying Living Expenses: Not on file  Food Insecurity:   . Worried About Charity fundraiser in the Last Year: Not on file  . Ran Out of Food in the Last Year: Not on file  Transportation Needs:   . Lack of Transportation (Medical): Not on file  . Lack of Transportation (Non-Medical): Not on file  Physical Activity:   . Days of Exercise per Week: Not on file  . Minutes of Exercise per Session: Not on file  Stress:   . Feeling of Stress : Not on file  Social Connections:   . Frequency of Communication with Friends and Family: Not on file  . Frequency of Social Gatherings with Friends and Family: Not on file  . Attends Religious Services: Not on file  . Active Member of Clubs or Organizations: Not on file  .  Attends Archivist Meetings: Not on file  . Marital Status: Not on file     Family History: The patient's family history includes Prostate cancer in her father; Stomach cancer in her mother.  ROS:   Please see the history of present illness.     All other systems reviewed and are negative.  EKGs/Labs/Other Studies Reviewed:    The following studies were reviewed today:   EKG:  EKG is ordered today.  The ekg ordered today demonstrates normal sinus rhythm with sinus arrhythmia, poor R wave progression  Recent Labs: 11/17/2018: ALT 13; BUN 14; Creatinine, Ser 0.69; Potassium 4.5; Sodium 139  Recent Lipid Panel    Component Value Date/Time   CHOL 209 (H) 08/01/2017 1204   TRIG 145.0 04/02/2013 0852   HDL 47.80 08/01/2017 1204   CHOLHDL 5 04/02/2013 0852   VLDL 29.0 04/02/2013 0852   LDLCALC 115 (H) 04/02/2013 0852    TTE 03/06/19: 1. Left ventricular ejection fraction, by visual estimation, is 60 to  65%. The left ventricle has normal function. There is no left ventricular  hypertrophy.  2. The left ventricle has no regional wall motion abnormalities.  3. Global right ventricle has normal systolic function.The right  ventricular size is normal. No increase in right ventricular wall  thickness.  4. Left atrial size was normal.  5. Right atrial size was normal.  6. Presence of pericardial fat pad.  7. Trivial pericardial effusion is present.  8. The mitral valve is grossly normal. Mild mitral valve regurgitation.  9. The tricuspid valve is grossly normal. Tricuspid valve regurgitation  is trivial.  10. The aortic valve is tricuspid. Aortic valve regurgitation is not  visualized. No evidence of aortic valve sclerosis or stenosis.  11. The pulmonic valve was grossly normal. Pulmonic valve regurgitation is  trivial.  12. Normal pulmonary artery systolic pressure.  13. The tricuspid regurgitant velocity is 1.77 m/s, and with an assumed  right atrial  pressure of 3 mmHg, the estimated right ventricular systolic  pressure is normal at 15.6 mmHg.  14. The inferior vena cava is normal in size with greater than 50%  respiratory variability, suggesting right atrial pressure of 3 mmHg.  15. No prior Echocardiogram.   Physical Exam:    VS:  BP 122/81   Pulse 78   Ht 5\' 8"  (1.727 m)   Wt 215 lb (97.5 kg)   SpO2 98%   BMI 32.69 kg/m     Wt Readings from Last 3 Encounters:  05/08/19 215 lb (97.5 kg)  03/05/19 213 lb 3.2 oz (96.7 kg)  01/03/19 219 lb 6 oz (99.5 kg)  GEN:  Well nourished, well developed in no acute distress HEENT: Normal NECK: No JVD; No carotid bruits LYMPHATICS: No lymphadenopathy CARDIAC: RRR, no murmurs, rubs, gallops RESPIRATORY:  Clear to auscultation without rales, wheezing or rhonchi  ABDOMEN: Soft, non-tender, non-distended MUSCULOSKELETAL:  No edema; No deformity  SKIN: Warm and dry NEUROLOGIC:  Alert and oriented x 3 PSYCHIATRIC:  Normal affect   ASSESSMENT:    1. Pre-op evaluation   2. Nonspecific abnormal electrocardiogram (ECG) (EKG)   3. Palpitations   4. OSA (obstructive sleep apnea)    PLAN:    In order of problems listed above:  Preop evaluation: Considering surgery for plantar fasciitis.  No symptoms to suggest active cardiac condition.  Good functional capacity, greater than 4 METS.  RCRI score 0.  Overall would classify as low risk of major adverse cardiac event and no further cardiac work-up recommended prior to surgery.  EKG abnormalities: Poor R wave progression.  TTE shows no abnormalities  Palpitations: Description consistent with likely PACs or PVCs, as described feeling like heart skips a beat, last for a second and resolves.  This only occurs about once per month, no further work-up at this time.  If occurring more frequently, can evaluate with Holter monitor to quantify burden of ectopy  OSA: Continue CPAP  RTC in 1 year  Medication Adjustments/Labs and Tests  Ordered: Current medicines are reviewed at length with the patient today.  Concerns regarding medicines are outlined above.  No orders of the defined types were placed in this encounter.  No orders of the defined types were placed in this encounter.   Patient Instructions  Medication Instructions:  Your physician recommends that you continue on your current medications as directed. Please refer to the Current Medication list given to you today.  *If you need a refill on your cardiac medications before your next appointment, please call your pharmacy*  Lab Work: NONE  Testing/Procedures: NONE  Follow-Up: At [facility], you and your health needs are our priority.  As part of our continuing mission to provide you with exceptional heart care, we have created designated Provider Care Teams.  These Care Teams include your primary Cardiologist (physician) and Advanced Practice Providers (APPs -  Physician Assistants and Nurse Practitioners) who all work together to provide you with the care you need, when you need it.  Your next appointment:   12 month(s)  The format for your next appointment:   In Person  Provider:   Epifanio Lesches, MD      Signed, Little Ishikawa, MD  05/08/2019 9:05 AM    [facility]

## 2019-05-08 ENCOUNTER — Encounter: Payer: Self-pay | Admitting: Cardiology

## 2019-05-08 ENCOUNTER — Ambulatory Visit (INDEPENDENT_AMBULATORY_CARE_PROVIDER_SITE_OTHER): Payer: 59 | Admitting: Cardiology

## 2019-05-08 ENCOUNTER — Other Ambulatory Visit: Payer: Self-pay

## 2019-05-08 ENCOUNTER — Encounter: Payer: Self-pay | Admitting: Family Medicine

## 2019-05-08 VITALS — BP 122/81 | HR 78 | Ht 68.0 in | Wt 215.0 lb

## 2019-05-08 DIAGNOSIS — G4733 Obstructive sleep apnea (adult) (pediatric): Secondary | ICD-10-CM | POA: Diagnosis not present

## 2019-05-08 DIAGNOSIS — Z01818 Encounter for other preprocedural examination: Secondary | ICD-10-CM

## 2019-05-08 DIAGNOSIS — R9431 Abnormal electrocardiogram [ECG] [EKG]: Secondary | ICD-10-CM

## 2019-05-08 DIAGNOSIS — R002 Palpitations: Secondary | ICD-10-CM | POA: Diagnosis not present

## 2019-05-08 NOTE — Patient Instructions (Signed)
Medication Instructions:  Your physician recommends that you continue on your current medications as directed. Please refer to the Current Medication list given to you today.  *If you need a refill on your cardiac medications before your next appointment, please call your pharmacy*  Lab Work: NONE  Testing/Procedures: NONE  Follow-Up: At [facility], you and your health needs are our priority.  As part of our continuing mission to provide you with exceptional heart care, we have created designated Provider Care Teams.  These Care Teams include your primary Cardiologist (physician) and Advanced Practice Providers (APPs -  Physician Assistants and Nurse Practitioners) who all work together to provide you with the care you need, when you need it.  Your next appointment:   12 month(s)  The format for your next appointment:   In Person  Provider:   Christopher Schumann, MD   

## 2019-06-08 ENCOUNTER — Ambulatory Visit
Admission: RE | Admit: 2019-06-08 | Discharge: 2019-06-08 | Disposition: A | Discharge status: Home / Self Care | Payer: 59 | Source: Ambulatory Visit | Attending: Family Medicine | Admitting: Family Medicine

## 2019-06-08 ENCOUNTER — Other Ambulatory Visit: Payer: Self-pay

## 2019-06-08 ENCOUNTER — Other Ambulatory Visit: Payer: Self-pay | Admitting: Family Medicine

## 2019-06-08 DIAGNOSIS — N6310 Unspecified lump in the right breast, unspecified quadrant: Secondary | ICD-10-CM

## 2019-06-26 ENCOUNTER — Ambulatory Visit (INDEPENDENT_AMBULATORY_CARE_PROVIDER_SITE_OTHER): Payer: 59 | Admitting: Sports Medicine

## 2019-06-26 ENCOUNTER — Other Ambulatory Visit: Payer: Self-pay

## 2019-06-26 ENCOUNTER — Ambulatory Visit
Admission: RE | Admit: 2019-06-26 | Discharge: 2019-06-26 | Disposition: A | Discharge status: Home / Self Care | Payer: 59 | Source: Ambulatory Visit | Attending: Sports Medicine | Admitting: Sports Medicine

## 2019-06-26 VITALS — BP 120/76 | Ht 68.0 in | Wt 205.0 lb

## 2019-06-26 DIAGNOSIS — M25562 Pain in left knee: Secondary | ICD-10-CM

## 2019-06-26 NOTE — Progress Notes (Signed)
PCP: [place], Betty G, MD  Subjective:   HPI: Patient is a 54 y.o. female here for evaluation of left knee pain.  She was skiing 4 weeks ago and fell over her skis. The left ski stayed on and resulted in her leg being twisted backward, she was able to remove the ski but when she stood up she had severe knee pain. She states that when EMS evaluated her, they felt her "knee pop back into place." She states the there was immediate swelling of her entire knee at the time of the injury. Since that time she has had pain in the knee behind the patella and lateral joint line. She endorses popping of the knee when walking and feeling of instability especially when walking downhill and down stairs.   Past Medical History:  Diagnosis Date  . Sleep apnea     Past Surgical History:  Procedure Laterality Date  . ABDOMINAL HYSTERECTOMY    . FOOT SURGERY      No Known Allergies   BP 120/76   Ht 5\' 8"  (1.727 m)   Wt 205 lb (93 kg)   BMI 31.17 kg/m   Review of Systems: See HPI above.     Objective:  Physical Exam:  Gen: NAD, comfortable in exam room  Left knee: Scar present from traumatic injury at 54 yo, no evident effusion, erythema or ecchymosis TTP at lateral joint line, pain with knee flexion Limited range of motion flexion to 100 degrees, full ROM extension Valgus/varus stress: Neg Ant drawer/Lachmans: Positive Post drawer: Neg McMurrays: Crepitus and pain at left lateral joint line but no pop evident Negative patellar apprehension test.   Assessment & Plan:  1. Possible ACL/meniscal tear: Due to the traumatic injury and her physical exam findings (positive Ant drawer/lachmans, crepitus and pain with knee flexion/McMurrays test), she is in need of an MRI to evaluate for ACL/meniscal tears. Will follow up after MRI results to determine management.  Patient seen and evaluated with the medical student.  I agree with the above plan of care.  There is concern about possible ACL tear.   X-rays of the left knee show a small joint effusion but nothing acute.  There is an ossific fragment along the lateral femoral condyle which appears to be chronic.  Patient has a remote history of left knee trauma requiring surgery at the age of 38.  Comment is made by the radiologist that this may however be a loose body.  We will schedule an MRI to evaluate further as well as to rule out ACL tear.  Phone follow-up with those results when available and we will delineate a more definitive treatment plan based on those findings.

## 2019-06-27 ENCOUNTER — Encounter: Payer: Self-pay | Admitting: Sports Medicine

## 2019-07-02 ENCOUNTER — Telehealth: Payer: Self-pay | Admitting: Sports Medicine

## 2019-07-02 NOTE — Telephone Encounter (Signed)
  I spoke with the patient on the phone today after reviewing the MRI of her left knee done over the weekend.  She has a complete tear of the ACL.  Also grade 3 tear of the MCL and complex tears of the lateral and medial menisci.  This is all in the setting of moderate osteoarthritis.  Based on this constellation of symptoms I recommended surgical consultation with Dr. Fara Chute.  I will defer further treatment to his discretion.  Patient will follow up with me as needed.

## 2019-07-03 ENCOUNTER — Ambulatory Visit (INDEPENDENT_AMBULATORY_CARE_PROVIDER_SITE_OTHER): Payer: 59 | Admitting: Podiatry

## 2019-07-03 ENCOUNTER — Ambulatory Visit (INDEPENDENT_AMBULATORY_CARE_PROVIDER_SITE_OTHER): Payer: 59

## 2019-07-03 ENCOUNTER — Encounter: Payer: Self-pay | Admitting: Podiatry

## 2019-07-03 ENCOUNTER — Other Ambulatory Visit: Payer: Self-pay

## 2019-07-03 VITALS — BP 148/83 | HR 72 | Temp 97.6°F

## 2019-07-03 DIAGNOSIS — M722 Plantar fascial fibromatosis: Secondary | ICD-10-CM

## 2019-07-03 DIAGNOSIS — S93692A Other sprain of left foot, initial encounter: Secondary | ICD-10-CM

## 2019-07-03 NOTE — Progress Notes (Signed)
Submit Clinical Request  Patient ID: [id number]   Time: Time: 07/03/2019 5:05 PM  Patient Name: Carmen Knight, Carmen Knight  Patient DOB: Jul 21, 1965  Physician Name: Ernestene Kiel  Physician Address: [address] [place], [place] [address]  Physician Tax FS:[id number]  CPT Code: [phone]  Case Number: [id number]  This member's plan does not currently require notification or prior-authorization through the [employer] Notification or Prior-Authorization Program. Please contact a Customer Care Professional at [phone] if you believe the information returned to be in error.

## 2019-07-03 NOTE — Progress Notes (Signed)
  Subjective:  Patient ID: Carmen Knight, female    DOB: 12-08-1965,  MRN: [id number] HPI Chief Complaint  Patient presents with  . Foot Pain    Plantar heel left - aching x 1 year+, saw podiatrist- treatment included meds, injections, PT, night splint, never got better, also tried different shoes and OTC insoles, the other doc suggested surgery - wants 2nd opinion  . New Patient (Initial Visit)    54 y.o. female presents with the above complaint.   ROS: Denies fever chills nausea vomiting muscle aches pains calf pain back pain chest pain shortness of breath.  Past Medical History:  Diagnosis Date  . Sleep apnea    Past Surgical History:  Procedure Laterality Date  . ABDOMINAL HYSTERECTOMY    . FOOT SURGERY      Current Outpatient Medications:  .  albuterol (PROVENTIL HFA;VENTOLIN HFA) 108 (90 Base) MCG/ACT inhaler, Take 2 puffs by mouth every 6 hours as needed, Disp: 8.5 Inhaler, Rfl: 0 .  fexofenadine (ALLEGRA) 180 MG tablet, Take 180 mg by mouth as needed for allergies or rhinitis. Reported on 07/02/2015, Disp: , Rfl:  .  progesterone (PROMETRIUM) 100 MG capsule, , Disp: , Rfl:   Current Facility-Administered Medications:  .  0.9 %  sodium chloride infusion, 500 mL, Intravenous, Continuous, Rachael Fee, MD  No Known Allergies Review of Systems Objective:   Vitals:   07/03/19 1623  BP: (!) 148/83  Pulse: 72  Temp: 97.6 F (36.4 C)    General: Well developed, nourished, in no acute distress, alert and oriented x3   Dermatological: Skin is warm, dry and supple bilateral. Nails x 10 are well maintained; remaining integument appears unremarkable at this time. There are no open sores, no preulcerative lesions, no rash or signs of infection present.  Vascular: Dorsalis Pedis artery and Posterior Tibial artery pedal pulses are 2/4 bilateral with immedate capillary fill time. Pedal hair growth present. No varicosities and no lower extremity edema present bilateral.    Neruologic: Grossly intact via light touch bilateral. Vibratory intact via tuning fork bilateral. Protective threshold with Semmes Wienstein monofilament intact to all pedal sites bilateral. Patellar and Achilles deep tendon reflexes 2+ bilateral. No Babinski or clonus noted bilateral.   Musculoskeletal: No gross boney pedal deformities bilateral. No pain, crepitus, or limitation noted with foot and ankle range of motion bilateral. Muscular strength 5/5 in all groups tested bilateral.  Severe pain on palpation medial calcaneal tubercle of the left heel.  Gait: Unassisted, Nonantalgic.    Radiographs:  Radiographs taken today demonstrate soft tissue increase in density at the plantar fascial kidney insertion site.  Also demonstrates a 4 oh screw down the shaft of the fifth metatarsal for an old Jones fracture.  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  She also has a deranged knee after skiing per MRI.  Plan: At this point we will request MRI of the left rear foot secondary to the chronicity of her heel pain.  Expect to find a plantar fascial tear for surgical reconstruction.  Dr. Yisroel Ramming is going to repair her knee and she will see him next week.     Triton Heidrich T. [place], [place]

## 2019-07-04 ENCOUNTER — Telehealth: Payer: Self-pay | Admitting: *Deleted

## 2019-07-04 ENCOUNTER — Encounter: Payer: Self-pay | Admitting: Sports Medicine

## 2019-07-04 DIAGNOSIS — S93692A Other sprain of left foot, initial encounter: Secondary | ICD-10-CM

## 2019-07-04 DIAGNOSIS — M722 Plantar fascial fibromatosis: Secondary | ICD-10-CM

## 2019-07-04 NOTE — Telephone Encounter (Signed)
-----   Message from Kristian Covey, [facility] sent at 07/03/2019  4:57 PM EDT ----- Regarding: MRI MRI left ankle - evaluate plantar fascial tear left - surgical consideration

## 2019-07-04 NOTE — Telephone Encounter (Signed)
Orders and pre-cert faxed to [facility].

## 2019-07-13 ENCOUNTER — Telehealth: Payer: Self-pay | Admitting: Cardiology

## 2019-07-13 NOTE — Telephone Encounter (Signed)
   Primary Cardiologist: Little Ishikawa, MD  Chart reviewed as part of pre-operative protocol coverage. Given past medical history and time since last visit, based on ACC/AHA guidelines, Carmen Knight would be at acceptable risk for the planned procedure without further cardiovascular testing.   Patient does not take any blood thinning medications at this time.   I will route this recommendation to the requesting party via Epic fax function and remove from pre-op pool.  Please call with questions.  Thomasene Ripple. Spencer Cardinal NP-C    07/13/2019, 1:06 PM [facility] [address] Office [phone] Fax [phone]

## 2019-07-13 NOTE — Telephone Encounter (Signed)
° °  ° °  [facility] Pre-operative Risk Assessment    Request for surgical clearance:  1. What type of surgery is being performed? Left knee ACL reconstruction and orthroscopy   2. When is this surgery scheduled? 08/09/19   3. What type of clearance is required (medical clearance vs. Pharmacy clearance to hold med vs. Both)? Both  4. Are there any medications that need to be held prior to surgery and how long? Any meds pt has that needs to be held    5. Practice name and name of physician performing surgery? Dr. Melrose Nakayama, [facility]  6. What is your office phone number [phone]    7.   What is your office fax number [phone]  8.   Anesthesia type (None, local, MAC, general) ? General   Angeline S Hammer 07/13/2019, 12:25 PM  _________________________________________________________________   (provider comments below)

## 2019-07-14 ENCOUNTER — Other Ambulatory Visit: Payer: 59

## 2019-07-21 ENCOUNTER — Other Ambulatory Visit: Payer: 59

## 2019-07-23 ENCOUNTER — Other Ambulatory Visit: Payer: Self-pay

## 2019-07-24 ENCOUNTER — Ambulatory Visit (INDEPENDENT_AMBULATORY_CARE_PROVIDER_SITE_OTHER): Payer: 59 | Admitting: Family Medicine

## 2019-07-24 ENCOUNTER — Encounter: Payer: Self-pay | Admitting: Family Medicine

## 2019-07-24 VITALS — BP 120/72 | HR 73 | Temp 97.6°F | Resp 12 | Ht 68.0 in | Wt 214.0 lb

## 2019-07-24 DIAGNOSIS — Z01818 Encounter for other preprocedural examination: Secondary | ICD-10-CM | POA: Diagnosis not present

## 2019-07-24 LAB — BASIC METABOLIC PANEL
BUN: 15 mg/dL (ref 6–23)
CO2: 32 mEq/L (ref 19–32)
Calcium: 9.4 mg/dL (ref 8.4–10.5)
Chloride: 104 mEq/L (ref 96–112)
Creatinine, Ser: 0.74 mg/dL (ref 0.40–1.20)
GFR: 81.58 mL/min (ref >60.00)
Glucose, Bld: 107 mg/dL — ABNORMAL HIGH (ref 70–99)
Potassium: 4.2 mEq/L (ref 3.5–5.1)
Sodium: 140 mEq/L (ref 135–145)

## 2019-07-24 LAB — CBC
HCT: 36.3 % (ref 36.0–46.0)
Hemoglobin: 12.2 g/dL (ref 12.0–15.0)
MCHC: 33.7 g/dL (ref 30.0–36.0)
MCV: 93.3 fl (ref 78.0–100.0)
Platelets: 225 10*3/uL (ref 150.0–400.0)
RBC: 3.9 Mil/uL (ref 3.87–5.11)
RDW: 13.2 % (ref 11.5–15.5)
WBC: 3.6 10*3/uL — ABNORMAL LOW (ref 4.0–10.5)

## 2019-07-24 NOTE — Progress Notes (Signed)
Chief Complaint  Patient presents with  . Surgical Clearance    having surgery on left knee acl   HPI: [name] is a 54 y.o. female with hx of OSA,asthma,and obesity who is here today for surgical clearance requested by Dr Norm Salt. Planning on having left knee ACL reconstruction and arthroscopy under general anesthesia on 08/09/2019. No Hx of CAD,CHF,CKD,DM,or HTN. No tobacco use.  Left knee injury while skiing around 05/26/19, LLE twisted backwards. 06/28/19 knee MRI: 1. Complete tear of the ACL with associated bone contusions. There is a small subacute impacted fracture of the far posterior aspect lateral tibial plateau.  2. Grade 3 MCL tear  3. Complex flap tear of the lateral meniscus posterior horn and root.  4. Vertical tear of the medial meniscus body-posterior horn.  5. Osteoarthritis with moderate and high-grade partial-thickness patellar cartilage loss. Moderate joint effusion and 15 mm loose body.  Left foot surgery under general anesthesia about 8 years ago. Ambulating the day after surgery. Had crutches for a week. No complications during or after surgery.  Negative for CP SOB,diaphoresis,or dizziness with clinbing a flight of stairs,walking a hill,or heavy lifing.  Hx of palpitations and abnormal EKG. Cardiac evaluation on 05/08/2019. Echo in 02/2019 LVEF 60-65%.  Asthma exacerbations with URI, last one about 2 years ago.  As far as she knows she is not having pre op labs in the hospital.  Review of Systems  Constitutional: Negative for activity change, appetite change, fatigue and fever.  HENT: Negative for mouth sores, nosebleeds and sore throat.   Eyes: Negative for redness and visual disturbance.  Respiratory: Negative for cough and wheezing.   Cardiovascular: Negative for leg swelling.  Gastrointestinal: Negative for abdominal pain, nausea and vomiting.       Negative for changes in bowel habits.  Genitourinary: Negative for decreased  urine volume, dysuria and hematuria.  Musculoskeletal: Positive for arthralgias, gait problem and joint swelling.  Skin: Negative for pallor and rash.  Neurological: Negative for syncope, weakness and headaches.  Rest of ROS, see pertinent positives sand negatives in HPI  Current Outpatient Medications on File Prior to Visit  Medication Sig Dispense Refill  . albuterol (PROVENTIL HFA;VENTOLIN HFA) 108 (90 Base) MCG/ACT inhaler Take 2 puffs by mouth every 6 hours as needed 8.5 Inhaler 0  . fexofenadine (ALLEGRA) 180 MG tablet Take 180 mg by mouth as needed for allergies or rhinitis. Reported on 07/02/2015    . progesterone (PROMETRIUM) 100 MG capsule      Current Facility-Administered Medications on File Prior to Visit  Medication Dose Route Frequency Provider Last Rate Last Admin  . 0.9 %  sodium chloride infusion  500 mL Intravenous Continuous Milus Banister, MD       Past Medical History:  Diagnosis Date  . Sleep apnea    No Known Allergies  Social History   Socioeconomic History  . Marital status: Married    Spouse name: Not on file  . Number of children: Not on file  . Years of education: Not on file  . Highest education level: Not on file  Occupational History  . Not on file  Tobacco Use  . Smoking status: Never Smoker  . Smokeless tobacco: Never Used  Substance and Sexual Activity  . Alcohol use: Yes    Alcohol/week: 2.0 standard drinks    Types: 2 Glasses of wine per week  . Drug use: No  . Sexual activity: Not on file  Other Topics Concern  .  Not on file  Social History Narrative  . Not on file   Social Determinants of Health   Financial Resource Strain:   . Difficulty of Paying Living Expenses:   Food Insecurity:   . Worried About Programme researcher, broadcasting/film/video in the Last Year:   . Barista in the Last Year:   Transportation Needs:   . Freight forwarder (Medical):   Marland Kitchen Lack of Transportation (Non-Medical):   Physical Activity:   . Days of Exercise  per Week:   . Minutes of Exercise per Session:   Stress:   . Feeling of Stress :   Social Connections:   . Frequency of Communication with Friends and Family:   . Frequency of Social Gatherings with Friends and Family:   . Attends Religious Services:   . Active Member of Clubs or Organizations:   . Attends Banker Meetings:   Marland Kitchen Marital Status:     Vitals:   07/24/19 1015  BP: 120/72  Pulse: 73  Resp: 12  Temp: 97.6 F (36.4 C)  SpO2: 98%   Body mass index is 32.54 kg/m.   Physical Exam  Nursing note and vitals reviewed. Constitutional: She is oriented to person, place, and time. She appears well-developed. No distress.  HENT:  Head: Normocephalic and atraumatic.  Mouth/Throat: Oropharynx is clear and moist and mucous membranes are normal.  Eyes: Pupils are equal, round, and reactive to light. Conjunctivae are normal.  Cardiovascular: Normal rate and regular rhythm.  No murmur heard. Pulses:      Dorsalis pedis pulses are 2+ on the right side and 2+ on the left side.  Respiratory: Effort normal and breath sounds normal. No respiratory distress.  GI: Soft. She exhibits no mass. There is no hepatomegaly. There is no abdominal tenderness.  Musculoskeletal:        General: No edema.     Left knee: Decreased range of motion. Tenderness present.     Comments: Antalgic gait. Left knee brace.  Lymphadenopathy:    She has no cervical adenopathy.  Neurological: She is alert and oriented to person, place, and time. She has normal strength. No cranial nerve deficit.  Skin: Skin is warm. No rash noted. No erythema.  Psychiatric: She has a normal mood and affect.  Well groomed, good eye contact.   ASSESSMENT AND PLAN:  [name] was seen today for surgical clearance, planning on left knee surgery.  Orders Placed This Encounter  Procedures  . Basic metabolic panel  . CBC   Lab Results  Component Value Date   WBC 3.6 (L) 07/24/2019   HGB 12.2  07/24/2019   HCT 36.3 07/24/2019   MCV 93.3 07/24/2019   PLT 225.0 07/24/2019   Lab Results  Component Value Date   CREATININE 0.74 07/24/2019   BUN 15 07/24/2019   NA 140 07/24/2019   K 4.2 07/24/2019   CL 104 07/24/2019   CO2 32 07/24/2019    1. Preop examination She really wants to proceed with surgery, it will improve quality of life. She was already instructed to stop all NSAID's at least 7 days before procedure. Stop all OTC supplements.  EKG in 02/2019 sinus arrhythmia, normal axis and intervals. T wave abnormalities on anterior leads and? IVCD. No significant changes when compared with EKG done on 11 03/10/2013.  Low risk for complications, explained that risk is never zero.  DVT prophylaxis:  Early ambulation. Consider oral anticoagulation for at least 10 days.  Clearance form will be completed and faxed as well as copy of note.   Betty G. [place], MD  [facility]. [place] office.  A few things to remember from today's visit:   Preop examination - Plan: Basic metabolic panel, CBC  We will send a copy of this visit and the completed form to your surgeon. Good luck with the knee surgery!  Please be sure medication list is accurate. If a new problem present, please set up appointment sooner than planned today.

## 2019-07-24 NOTE — Patient Instructions (Signed)
A few things to remember from today's visit:   Preop examination - Plan: Basic metabolic panel, CBC  We will send a copy of this visit and the completed form to your surgeon. Good luck with the knee surgery!  Please be sure medication list is accurate. If a new problem present, please set up appointment sooner than planned today.

## 2019-12-10 ENCOUNTER — Other Ambulatory Visit: Payer: Self-pay

## 2019-12-10 ENCOUNTER — Ambulatory Visit
Admission: RE | Admit: 2019-12-10 | Discharge: 2019-12-10 | Disposition: A | Discharge status: Home / Self Care | Payer: 59 | Source: Ambulatory Visit | Attending: Family Medicine | Admitting: Family Medicine

## 2019-12-10 DIAGNOSIS — N6310 Unspecified lump in the right breast, unspecified quadrant: Secondary | ICD-10-CM

## 2019-12-12 ENCOUNTER — Encounter: Payer: Self-pay | Admitting: Internal Medicine

## 2019-12-12 ENCOUNTER — Telehealth (INDEPENDENT_AMBULATORY_CARE_PROVIDER_SITE_OTHER): Payer: 59 | Admitting: Internal Medicine

## 2019-12-12 VITALS — Wt 210.0 lb

## 2019-12-12 DIAGNOSIS — B029 Zoster without complications: Secondary | ICD-10-CM

## 2019-12-12 MED ORDER — VALACYCLOVIR HCL 1 G PO TABS: 1000.0000 mg | ORAL_TABLET | Freq: Three times a day (TID) | ORAL | 0 refills | Status: AC

## 2019-12-12 NOTE — Progress Notes (Signed)
Virtual Visit via Video Note  I connected with Carmen Knight on 12/12/19 at  3:30 PM EDT by a video enabled telemedicine application and verified that I am speaking with the correct person using two identifiers.  Location patient: home Location provider: work office Persons participating in the virtual visit: patient, provider  I discussed the limitations of evaluation and management by telemedicine and the availability of in person appointments. The patient expressed understanding and agreed to proceed.   HPI: She had onset of a rash over her right lateral rib cage 2 days ago.  They look like little vesicles with an erythematous base.  Yesterday she had a second cropping of vesicles appear in the same level but further back.  It is itchy and a little painful.   ROS: Constitutional: Denies fever, chills, diaphoresis, appetite change and fatigue.  HEENT: Denies photophobia, eye pain, redness, hearing loss, ear pain, congestion, sore throat, rhinorrhea, sneezing, mouth sores, trouble swallowing, neck pain, neck stiffness and tinnitus.   Respiratory: Denies SOB, DOE, cough, chest tightness,  and wheezing.   Cardiovascular: Denies chest pain, palpitations and leg swelling.  Gastrointestinal: Denies nausea, vomiting, abdominal pain, diarrhea, constipation, blood in stool and abdominal distention.  Genitourinary: Denies dysuria, urgency, frequency, hematuria, flank pain and difficulty urinating.  Endocrine: Denies: hot or cold intolerance, sweats, changes in hair or nails, polyuria, polydipsia. Musculoskeletal: Denies myalgias, back pain, joint swelling, arthralgias and gait problem.  Skin: Positive for new rash Neurological: Denies dizziness, seizures, syncope, weakness, light-headedness, numbness and headaches.  Hematological: Denies adenopathy. Easy bruising, personal or family bleeding history  Psychiatric/Behavioral: Denies suicidal ideation, mood changes, confusion, nervousness,  sleep disturbance and agitation   Past Medical History:  Diagnosis Date  . Sleep apnea     Past Surgical History:  Procedure Laterality Date  . ABDOMINAL HYSTERECTOMY    . FOOT SURGERY      Family History  Problem Relation Age of Onset  . Prostate cancer Father   . Stomach cancer Mother        passed away with stomach cancer     SOCIAL HX:   reports that she has never smoked. She has never used smokeless tobacco. She reports current alcohol use of about 2.0 standard drinks of alcohol per week. She reports that she does not use drugs.   Current Outpatient Medications:  .  albuterol (PROVENTIL HFA;VENTOLIN HFA) 108 (90 Base) MCG/ACT inhaler, Take 2 puffs by mouth every 6 hours as needed, Disp: 8.5 Inhaler, Rfl: 0 .  fexofenadine (ALLEGRA) 180 MG tablet, Take 180 mg by mouth as needed for allergies or rhinitis. Reported on 07/02/2015, Disp: , Rfl:  .  valACYclovir (VALTREX) 1000 MG tablet, Take 1 tablet (1,000 mg total) by mouth 3 (three) times daily for 7 days., Disp: 21 tablet, Rfl: 0  Current Facility-Administered Medications:  .  0.9 %  sodium chloride infusion, 500 mL, Intravenous, Continuous, Rachael Fee, MD  EXAM:   VITALS per patient if applicable: None reported  GENERAL: alert, oriented, appears well and in no acute distress  HEENT: atraumatic, conjunttiva clear, no obvious abnormalities on inspection of external nose and ears  NECK: normal movements of the head and neck  LUNGS: on inspection no signs of respiratory distress, breathing rate appears normal, no obvious gross increased work of breathing, gasping or wheezing  Skin: On her right lateral torso right at the mid axillary line she has 2 distinct areas of vesicles with an erythematous base.  CV:  no obvious cyanosis  MS: moves all visible extremities without noticeable abnormality  PSYCH/NEURO: pleasant and cooperative, no obvious depression or anxiety, speech and thought processing grossly  intact  ASSESSMENT AND PLAN:   Herpes zoster without complication  -Looks like shingles, start Valtrex 3 times daily for 7 days. -She knows to return in 10 to 14 days if no improvement.    I discussed the assessment and treatment plan with the patient. The patient was provided an opportunity to ask questions and all were answered. The patient agreed with the plan and demonstrated an understanding of the instructions.   The patient was advised to call back or seek an in-person evaluation if the symptoms worsen or if the condition fails to improve as anticipated.    Chaya Jan, MD  [facility]

## 2020-01-14 ENCOUNTER — Other Ambulatory Visit: Payer: Self-pay

## 2020-01-14 ENCOUNTER — Encounter: Payer: Self-pay | Admitting: Family Medicine

## 2020-01-14 ENCOUNTER — Ambulatory Visit (INDEPENDENT_AMBULATORY_CARE_PROVIDER_SITE_OTHER): Payer: 59 | Admitting: Family Medicine

## 2020-01-14 VITALS — BP 128/80 | HR 74 | Resp 16 | Ht 68.0 in | Wt 218.0 lb

## 2020-01-14 DIAGNOSIS — E785 Hyperlipidemia, unspecified: Secondary | ICD-10-CM | POA: Diagnosis not present

## 2020-01-14 DIAGNOSIS — Z0000 Encounter for general adult medical examination without abnormal findings: Secondary | ICD-10-CM

## 2020-01-14 DIAGNOSIS — Z1329 Encounter for screening for other suspected endocrine disorder: Secondary | ICD-10-CM | POA: Diagnosis not present

## 2020-01-14 DIAGNOSIS — Z1159 Encounter for screening for other viral diseases: Secondary | ICD-10-CM

## 2020-01-14 DIAGNOSIS — Z130 Encounter for screening for diseases of the blood and blood-forming organs and certain disorders involving the immune mechanism: Secondary | ICD-10-CM

## 2020-01-14 DIAGNOSIS — Z13228 Encounter for screening for other metabolic disorders: Secondary | ICD-10-CM

## 2020-01-14 NOTE — Progress Notes (Signed)
HPI: Carmen Knight is a pleasant 54 y.o. female, who is here today for her routine physical.  Last CPE: 08/01/17.  Regular exercise 3 or more time per week: 5 months ago she had ACL surgery and was recently cleared to start exercising. She is doing the elliptical and stationary bike 3 days per week. She is also walking a few times per week. Following a healthy diet: She thinks sometimes she eats too much sugar but overall she tries to eat healthy. She lives with her husband.  Chronic medical problems: OSA,GERD,asthma with URI. She has not had URI in a while.  Pap smear: 08/01/17 Negative for malignancy and HPV was not detected. Hx of abnormal pap smears: Negative.  Immunization History  Administered Date(s) Administered  . Influenza,inj,Quad PF,6+ Mos 12/31/2016, 01/03/2019   Mammogram: 12/10/19. Colonoscopy: 02/18/16 DEXA: N/A Hep C screening: Never  She has no new concerns today.  HLD: She is on non pharmacologic treatment.  Recently seen for acute visit, dx'ed with zoster infection.Planning on getting Shingrix in the next few weeks.  Review of Systems  Constitutional: Negative for appetite change, fatigue and fever.  HENT: Negative for dental problem, hearing loss, mouth sores and sore throat.   Eyes: Negative for redness and visual disturbance.  Respiratory: Negative for cough, shortness of breath and wheezing.   Cardiovascular: Negative for chest pain and leg swelling.  Gastrointestinal: Negative for abdominal pain, nausea and vomiting.       No changes in bowel habits.  Endocrine: Negative for cold intolerance, heat intolerance, polydipsia, polyphagia and polyuria.  Genitourinary: Negative for decreased urine volume, dysuria, hematuria, vaginal bleeding and vaginal discharge.  Musculoskeletal: Negative for gait problem and myalgias.  Skin: Negative for color change and rash.  Allergic/Immunologic: Positive for environmental allergies.  Neurological: Negative  for syncope, weakness and headaches.  Hematological: Negative for adenopathy. Does not bruise/bleed easily.  Psychiatric/Behavioral: Negative for behavioral problems and confusion.  All other systems reviewed and are negative.  Current Outpatient Medications on File Prior to Visit  Medication Sig Dispense Refill  . albuterol (PROVENTIL HFA;VENTOLIN HFA) 108 (90 Base) MCG/ACT inhaler Take 2 puffs by mouth every 6 hours as needed 8.5 Inhaler 0  . fexofenadine (ALLEGRA) 180 MG tablet Take 180 mg by mouth as needed for allergies or rhinitis. Reported on 07/02/2015     No current facility-administered medications on file prior to visit.    Past Medical History:  Diagnosis Date  . Sleep apnea    Past Surgical History:  Procedure Laterality Date  . ABDOMINAL HYSTERECTOMY    . FOOT SURGERY     No Known Allergies  Family History  Problem Relation Age of Onset  . Prostate cancer Father   . Stomach cancer Mother        passed away with stomach cancer     Social History   Socioeconomic History  . Marital status: Married    Spouse name: Not on file  . Number of children: Not on file  . Years of education: Not on file  . Highest education level: Not on file  Occupational History  . Not on file  Tobacco Use  . Smoking status: Never Smoker  . Smokeless tobacco: Never Used  Substance and Sexual Activity  . Alcohol use: Yes    Alcohol/week: 2.0 standard drinks    Types: 2 Glasses of wine per week  . Drug use: No  . Sexual activity: Not on file  Other Topics Concern  .  Not on file  Social History Narrative  . Not on file   Social Determinants of Health   Financial Resource Strain:   . Difficulty of Paying Living Expenses: Not on file  Food Insecurity:   . Worried About Programme researcher, broadcasting/film/video in the Last Year: Not on file  . Ran Out of Food in the Last Year: Not on file  Transportation Needs:   . Lack of Transportation (Medical): Not on file  . Lack of Transportation  (Non-Medical): Not on file  Physical Activity:   . Days of Exercise per Week: Not on file  . Minutes of Exercise per Session: Not on file  Stress:   . Feeling of Stress : Not on file  Social Connections:   . Frequency of Communication with Friends and Family: Not on file  . Frequency of Social Gatherings with Friends and Family: Not on file  . Attends Religious Services: Not on file  . Active Member of Clubs or Organizations: Not on file  . Attends Banker Meetings: Not on file  . Marital Status: Not on file   Vitals:   01/14/20 0758  BP: 128/80  Pulse: 74  Resp: 16  SpO2: 98%   Body mass index is 33.15 kg/m.  Wt Readings from Last 3 Encounters:  01/14/20 218 lb (98.9 kg)  12/12/19 210 lb (95.3 kg)  07/24/19 214 lb (97.1 kg)   Physical Exam Vitals and nursing note reviewed.  Constitutional:      General: She is not in acute distress.    Appearance: She is well-developed.  HENT:     Head: Normocephalic and atraumatic.     Right Ear: Hearing, tympanic membrane, ear canal and external ear normal.     Left Ear: Hearing, tympanic membrane, ear canal and external ear normal.     Mouth/Throat:     Mouth: Mucous membranes are moist.     Pharynx: Oropharynx is clear. Uvula midline.  Eyes:     Extraocular Movements: Extraocular movements intact.     Conjunctiva/sclera: Conjunctivae normal.     Pupils: Pupils are equal, round, and reactive to light.  Neck:     Thyroid: No thyromegaly.     Trachea: No tracheal deviation.  Cardiovascular:     Rate and Rhythm: Normal rate and regular rhythm.     Pulses:          Dorsalis pedis pulses are 2+ on the right side and 2+ on the left side.     Heart sounds: No murmur heard.   Pulmonary:     Effort: Pulmonary effort is normal. No respiratory distress.     Breath sounds: Normal breath sounds.  Abdominal:     Palpations: Abdomen is soft. There is no hepatomegaly or mass.     Tenderness: There is no abdominal  tenderness.  Genitourinary:    Comments: Breast: No masses,nipple discharge,or skin changes bilateral. Musculoskeletal:     Comments: No major signs of synovitis appreciated.  Lymphadenopathy:     Cervical: No cervical adenopathy.     Upper Body:     Right upper body: No supraclavicular or axillary adenopathy.     Left upper body: No supraclavicular or axillary adenopathy.  Skin:    General: Skin is warm.     Findings: No erythema or rash.  Neurological:     Mental Status: She is alert and oriented to person, place, and time.     Cranial Nerves: No cranial nerve deficit.  Coordination: Coordination normal.     Gait: Gait normal.     Deep Tendon Reflexes:     Reflex Scores:      Bicep reflexes are 2+ on the right side and 2+ on the left side.      Patellar reflexes are 2+ on the right side and 2+ on the left side. Psychiatric:        Speech: Speech normal.     Comments: Well groomed, good eye contact.    ASSESSMENT AND PLAN:  Carmen Knight was here today annual physical examination.  Orders Placed This Encounter  Procedures  . BASIC METABOLIC PANEL WITH GFR  . Hepatitis C antibody  . Lipid panel  . Hemoglobin A1c    Lab Results  Component Value Date   HGBA1C 5.6 01/14/2020   Lab Results  Component Value Date   CREATININE 0.71 01/14/2020   BUN 15 01/14/2020   NA 139 01/14/2020   K 4.6 01/14/2020   CL 105 01/14/2020   CO2 25 01/14/2020   Lab Results  Component Value Date   CHOL 204 (H) 01/14/2020   HDL 52 01/14/2020   LDLCALC 124 (H) 01/14/2020   TRIG 161 (H) 01/14/2020   CHOLHDL 3.9 01/14/2020   Routine general medical examination at a health care facility We discussed the importance of regular physical activity and healthy diet for prevention of chronic illness and/or complications. Preventive guidelines reviewed. Vaccination up to date.  Ca++ and vit D supplementation recommended. Next CPE in a year.  The 10-year ASCVD risk score Denman George DC  Montez Hageman., et al., 2013) is: 2.1%   Values used to calculate the score:     Age: 46 years     Sex: Female     Is Non-Hispanic African American: No     Diabetic: No     Tobacco smoker: No     Systolic Blood Pressure: 128 mmHg     Is BP treated: No     HDL Cholesterol: 52 mg/dL     Total Cholesterol: 204 mg/dL  Hyperlipidemia, unspecified hyperlipidemia type Continue non pharmacologic treatment.  Screening for endocrine, metabolic and immunity disorder -     Hemoglobin A1c -     BASIC METABOLIC PANEL WITH GFR  Encounter for HCV screening test for low risk patient -     Hepatitis C antibody   Return in 1 year (on 01/13/2021) for cpe.  Silvina Hackleman G. [place], MD  [facility]. [place] [facility].  Today you have you routine preventive visit. A few things to remember from today's visit:   Routine general medical examination at a health care facility  Hyperlipidemia, unspecified hyperlipidemia type - Plan: Lipid panel  Screening for endocrine, metabolic and immunity disorder - Plan: BASIC METABOLIC PANEL WITH GFR, Hemoglobin A1c  Encounter for HCV screening test for low risk patient - Plan: Hepatitis C antibody  Please be sure medication list is accurate. If a new problem present, please set up appointment sooner than planned today.  At least 150 minutes of moderate exercise per week, daily brisk walking for 15-30 min is a good exercise option. Healthy diet low in saturated (animal) fats and sweets and consisting of fresh fruits and vegetables, lean meats such as fish and white chicken and whole grains.  These are some of recommendations for screening depending of age and risk factors:  - Vaccines:  Tdap vaccine every 10 years.  Shingles vaccine recommended at age 88, could be given after 55 years of  age but not sure about insurance coverage.   Pneumonia vaccines: Pneumovax at 65. Sometimes Pneumovax is giving earlier if history of smoking, lung disease,diabetes,kidney  disease among some.  Screening for diabetes at age 37 and every 3 years.  Cervical cancer prevention:  Pap smear starts at 55 years of age and continues periodically until 55 years old in low risk women. Pap smear every 3 years between 29 and 31 years old. Pap smear every 3-5 years between women 30 and older if pap smear negative and HPV screening negative.   -Breast cancer: Mammogram: There is disagreement between experts about when to start screening in low risk asymptomatic female but recent recommendations are to start screening at 69 and not later than 55 years old , every 1-2 years and after 55 yo q 2 years. Screening is recommended until 55 years old but some women can continue screening depending of healthy issues.  Colon cancer screening: Has been recently changed to 55 yo. Insurance may not cover until you are 55 years old. Screening is recommended until 55 years old.  Cholesterol disorder screening at age 82 and every 3 years.  Also recommended:  1. Dental visit- Brush and floss your teeth twice daily; visit your dentist twice a year. 2. Eye doctor- Get an eye exam at least every 2 years. 3. Helmet use- Always wear a helmet when riding a bicycle, motorcycle, rollerblading or skateboarding. 4. Safe sex- If you may be exposed to sexually transmitted infections, use a condom. 5. Seat belts- Seat belts can save your live; always wear one. 6. Smoke/Carbon Monoxide detectors- These detectors need to be installed on the appropriate level of your home. Replace batteries at least once a year. 7. Skin cancer- When out in the sun please cover up and use sunscreen 15 SPF or higher. 8. Violence- If anyone is threatening or hurting you, please tell your healthcare provider.  9. Drink alcohol in moderation- Limit alcohol intake to one drink or less per day. Never drink and drive. 10. Calcium supplementation 1000 to 1200 mg daily, ideally through your diet.  Vitamin D supplementation 800 units  daily.

## 2020-01-14 NOTE — Patient Instructions (Addendum)
Today you have you routine preventive visit. A few things to remember from today's visit:   Routine general medical examination at a health care facility  Hyperlipidemia, unspecified hyperlipidemia type - Plan: Lipid panel  Screening for endocrine, metabolic and immunity disorder - Plan: BASIC METABOLIC PANEL WITH GFR, Hemoglobin A1c  Encounter for HCV screening test for low risk patient - Plan: Hepatitis C antibody  Please be sure medication list is accurate. If a new problem present, please set up appointment sooner than planned today.  At least 150 minutes of moderate exercise per week, daily brisk walking for 15-30 min is a good exercise option. Healthy diet low in saturated (animal) fats and sweets and consisting of fresh fruits and vegetables, lean meats such as fish and white chicken and whole grains.  These are some of recommendations for screening depending of age and risk factors:  - Vaccines:  Tdap vaccine every 10 years.  Shingles vaccine recommended at age 37, could be given after 55 years of age but not sure about insurance coverage.   Pneumonia vaccines: Pneumovax at 65. Sometimes Pneumovax is giving earlier if history of smoking, lung disease,diabetes,kidney disease among some.  Screening for diabetes at age 16 and every 3 years.  Cervical cancer prevention:  Pap smear starts at 55 years of age and continues periodically until 55 years old in low risk women. Pap smear every 3 years between 16 and 8 years old. Pap smear every 3-5 years between women 30 and older if pap smear negative and HPV screening negative.   -Breast cancer: Mammogram: There is disagreement between experts about when to start screening in low risk asymptomatic female but recent recommendations are to start screening at 22 and not later than 55 years old , every 1-2 years and after 55 yo q 2 years. Screening is recommended until 55 years old but some women can continue screening depending of  healthy issues.  Colon cancer screening: Has been recently changed to 55 yo. Insurance may not cover until you are 55 years old. Screening is recommended until 55 years old.  Cholesterol disorder screening at age 36 and every 3 years.  Also recommended:  1. Dental visit- Brush and floss your teeth twice daily; visit your dentist twice a year. 2. Eye doctor- Get an eye exam at least every 2 years. 3. Helmet use- Always wear a helmet when riding a bicycle, motorcycle, rollerblading or skateboarding. 4. Safe sex- If you may be exposed to sexually transmitted infections, use a condom. 5. Seat belts- Seat belts can save your live; always wear one. 6. Smoke/Carbon Monoxide detectors- These detectors need to be installed on the appropriate level of your home. Replace batteries at least once a year. 7. Skin cancer- When out in the sun please cover up and use sunscreen 15 SPF or higher. 8. Violence- If anyone is threatening or hurting you, please tell your healthcare provider.  9. Drink alcohol in moderation- Limit alcohol intake to one drink or less per day. Never drink and drive. 10. Calcium supplementation 1000 to 1200 mg daily, ideally through your diet.  Vitamin D supplementation 800 units daily.

## 2020-01-15 LAB — HEMOGLOBIN A1C
Hgb A1c MFr Bld: 5.6 % of total Hgb (ref <5.7)
Mean Plasma Glucose: 114 (calc)
eAG (mmol/L): 6.3 (calc)

## 2020-01-15 LAB — BASIC METABOLIC PANEL WITH GFR
BUN: 15 mg/dL (ref 7–25)
CO2: 25 mmol/L (ref 20–32)
Calcium: 9.5 mg/dL (ref 8.6–10.4)
Chloride: 105 mmol/L (ref 98–110)
Creat: 0.71 mg/dL (ref 0.50–1.05)
GFR, Est African American: 111 mL/min/{1.73_m2} (ref >=60)
GFR, Est Non African American: 96 mL/min/{1.73_m2} (ref >=60)
Glucose, Bld: 105 mg/dL — ABNORMAL HIGH (ref 65–99)
Potassium: 4.6 mmol/L (ref 3.5–5.3)
Sodium: 139 mmol/L (ref 135–146)

## 2020-01-15 LAB — HEPATITIS C ANTIBODY
Hepatitis C Ab: NONREACTIVE
SIGNAL TO CUT-OFF: 0.01 (ref <1.00)

## 2020-01-15 LAB — LIPID PANEL
Cholesterol: 204 mg/dL — ABNORMAL HIGH (ref <200)
HDL: 52 mg/dL (ref >=50)
LDL Cholesterol (Calc): 124 mg/dL (calc) — ABNORMAL HIGH
Non-HDL Cholesterol (Calc): 152 mg/dL (calc) — ABNORMAL HIGH (ref <130)
Total CHOL/HDL Ratio: 3.9 (calc) (ref <5.0)
Triglycerides: 161 mg/dL — ABNORMAL HIGH (ref <150)

## 2020-04-13 NOTE — Progress Notes (Deleted)
Cardiology Office Note:    Date:  04/13/2020   ID:  Carmen Knight, DOB [birth year], MRN [id number]  PCP:  [place], Betty G, MD  Cardiologist:  Little Ishikawa, MD  Electrophysiologist:  None   Referring MD: [place], Betty G, MD   No chief complaint on file.   History of Present Illness:    Carmen Knight is a 55 y.o. female with a hx of OSA on CPAP who presents for follow-up.  She was initially seen on 03/05/2019 after going to a weight loss program in which an EKG was done and found to be abnormal (poor R wave progression).  She reports that she has not been exercising recently due to plantar fasciitis for which she is in physical therapy.  Prior to that she was exercising regularly, would walk 3 to 4 miles a day and do yoga.  She denies any exertional chest pain or dyspnea.  She reports occasional palpitations which she describes as a fluttering in her chest that lasts 1 to 2 seconds and resolves.  Occurs about once per month.  Never smoker.  No known history of heart disease in her immediate family.  Given EKG abnormalities, TTE was done on 03/06/2019, which showed no significant abnormalities.  Since last clinic visit,   States that since her last clinic visit, she has been doing well.  Denies any palpitations in the last few months.  Reports palpitations will typically occur in bursts and will not have any for a few months.  Has not noticed any relationship with caffeine or alcohol.  Reports she is very compliant with her CPAP.  She denies any chest pain, dyspnea, lower extremity edema, lightheadedness, or syncope.  Exercise has been limited by plantar fasciitis, considering surgery.  She walks about 1 mile per day.  Can walk up 2 flights of stairs without stopping.  Also does push-ups every day.    Past Medical History:  Diagnosis Date  . Sleep apnea     Past Surgical History:  Procedure Laterality Date  . ABDOMINAL HYSTERECTOMY    . FOOT SURGERY      Current  Medications: No outpatient medications have been marked as taking for the 04/16/20 encounter (Appointment) with Little Ishikawa, MD.     Allergies:   Patient has no known allergies.   Social History   Socioeconomic History  . Marital status: Married    Spouse name: Not on file  . Number of children: Not on file  . Years of education: Not on file  . Highest education level: Not on file  Occupational History  . Not on file  Tobacco Use  . Smoking status: Never Smoker  . Smokeless tobacco: Never Used  Substance and Sexual Activity  . Alcohol use: Yes    Alcohol/week: 2.0 standard drinks    Types: 2 Glasses of wine per week  . Drug use: No  . Sexual activity: Not on file  Other Topics Concern  . Not on file  Social History Narrative  . Not on file   Social Determinants of Health   Financial Resource Strain: Not on file  Food Insecurity: Not on file  Transportation Needs: Not on file  Physical Activity: Not on file  Stress: Not on file  Social Connections: Not on file     Family History: The patient's family history includes Prostate cancer in her father; Stomach cancer in her mother.  ROS:   Please see the history of present illness.  All other systems reviewed and are negative.  EKGs/Labs/Other Studies Reviewed:    The following studies were reviewed today:   EKG:  EKG is ordered today.  The ekg ordered today demonstrates normal sinus rhythm with sinus arrhythmia, poor R wave progression  Recent Labs: 07/24/2019: Hemoglobin 12.2; Platelets 225.0 01/14/2020: BUN 15; Creat 0.71; Potassium 4.6; Sodium 139  Recent Lipid Panel    Component Value Date/Time   CHOL 204 (H) 01/14/2020 0853   TRIG 161 (H) 01/14/2020 0853   HDL 52 01/14/2020 0853   CHOLHDL 3.9 01/14/2020 0853   VLDL 29.0 04/02/2013 0852   LDLCALC 124 (H) 01/14/2020 0853    TTE 03/06/19: 1. Left ventricular ejection fraction, by visual estimation, is 60 to  65%. The left ventricle has  normal function. There is no left ventricular  hypertrophy.  2. The left ventricle has no regional wall motion abnormalities.  3. Global right ventricle has normal systolic function.The right  ventricular size is normal. No increase in right ventricular wall  thickness.  4. Left atrial size was normal.  5. Right atrial size was normal.  6. Presence of pericardial fat pad.  7. Trivial pericardial effusion is present.  8. The mitral valve is grossly normal. Mild mitral valve regurgitation.  9. The tricuspid valve is grossly normal. Tricuspid valve regurgitation  is trivial.  10. The aortic valve is tricuspid. Aortic valve regurgitation is not  visualized. No evidence of aortic valve sclerosis or stenosis.  11. The pulmonic valve was grossly normal. Pulmonic valve regurgitation is  trivial.  12. Normal pulmonary artery systolic pressure.  13. The tricuspid regurgitant velocity is 1.77 m/s, and with an assumed  right atrial pressure of 3 mmHg, the estimated right ventricular systolic  pressure is normal at 15.6 mmHg.  14. The inferior vena cava is normal in size with greater than 50%  respiratory variability, suggesting right atrial pressure of 3 mmHg.  15. No prior Echocardiogram.   Physical Exam:    VS:  There were no vitals taken for this visit.    Wt Readings from Last 3 Encounters:  01/14/20 218 lb (98.9 kg)  12/12/19 210 lb (95.3 kg)  07/24/19 214 lb (97.1 kg)     GEN:  Well nourished, well developed in no acute distress HEENT: Normal NECK: No JVD; No carotid bruits LYMPHATICS: No lymphadenopathy CARDIAC: RRR, no murmurs, rubs, gallops RESPIRATORY:  Clear to auscultation without rales, wheezing or rhonchi  ABDOMEN: Soft, non-tender, non-distended MUSCULOSKELETAL:  No edema; No deformity  SKIN: Warm and dry NEUROLOGIC:  Alert and oriented x 3 PSYCHIATRIC:  Normal affect   ASSESSMENT:    No diagnosis found. PLAN:    EKG abnormalities: Poor R wave  progression.  TTE shows no abnormalities  Palpitations: Description consistent with likely PACs or PVCs, as described feeling like heart skips a beat, last for a second and resolves.  This only occurs about once per month, no further work-up at this time.  If occurring more frequently, can evaluate with Holter monitor to quantify burden of ectopy  OSA: Continue CPAP  RTC in ***  Medication Adjustments/Labs and Tests Ordered: Current medicines are reviewed at length with the patient today.  Concerns regarding medicines are outlined above.  No orders of the defined types were placed in this encounter.  No orders of the defined types were placed in this encounter.   There are no Patient Instructions on file for this visit.   Signed, Little Ishikawa, MD  04/13/2020 2:59  PM    [facility]

## 2020-04-16 ENCOUNTER — Ambulatory Visit: Payer: 59 | Admitting: Cardiology

## 2020-05-26 NOTE — Progress Notes (Signed)
Cardiology Office Note:    Date:  05/30/2020   ID:  Carmen Knight, DOB March 10, 1966, MRN [id number]  PCP:  [place], Betty G, MD  Cardiologist:  Little Ishikawa, MD  Electrophysiologist:  None   Referring MD: [place], Betty G, MD   Chief Complaint  Patient presents with  . Palpitations    History of Present Illness:    Carmen Knight is a 55 y.o. female with a hx of OSA on CPAP who presents for follow-up.  She was initially referred after going to a weight loss program in which an EKG was done and found to be abnormal (poor R wave progression).  She reports that she has not been exercising recently due to plantar fasciitis for which she is in physical therapy.  Prior to that she was exercising regularly, would walk 3 to 4 miles a day and do yoga.  She denies any exertional chest pain or dyspnea.  She reports occasional palpitations which she describes as a fluttering in her chest that lasts 1 to 2 seconds and resolves.  Occurs about once per month.  Never smoker.  No known history of heart disease in her immediate family.  Given EKG abnormalities, TTE was done on 03/06/2019, which showed no significant abnormalities.  States that since her last clinic visit, she reports that she has been doing well.  She had her knee surgery last May.  She is now walking at least 30 minutes/day with her dogs.  She denies any chest pain or dyspnea.  Reports no recent palpitations.  She denies any lightheadedness, syncope, or lower extremity edema.   Past Medical History:  Diagnosis Date  . Sleep apnea     Past Surgical History:  Procedure Laterality Date  . ABDOMINAL HYSTERECTOMY    . FOOT SURGERY      Current Medications: Current Meds  Medication Sig  . albuterol (PROVENTIL HFA;VENTOLIN HFA) 108 (90 Base) MCG/ACT inhaler Take 2 puffs by mouth every 6 hours as needed  . fexofenadine (ALLEGRA) 180 MG tablet Take 180 mg by mouth as needed for allergies or rhinitis. Reported on 07/02/2015      Allergies:   Patient has no known allergies.   Social History   Socioeconomic History  . Marital status: Married    Spouse name: Not on file  . Number of children: Not on file  . Years of education: Not on file  . Highest education level: Not on file  Occupational History  . Not on file  Tobacco Use  . Smoking status: Never Smoker  . Smokeless tobacco: Never Used  Substance and Sexual Activity  . Alcohol use: Yes    Alcohol/week: 2.0 standard drinks    Types: 2 Glasses of wine per week  . Drug use: No  . Sexual activity: Not on file  Other Topics Concern  . Not on file  Social History Narrative  . Not on file   Social Determinants of Health   Financial Resource Strain: Not on file  Food Insecurity: Not on file  Transportation Needs: Not on file  Physical Activity: Not on file  Stress: Not on file  Social Connections: Not on file     Family History: The patient's family history includes Prostate cancer in her father; Stomach cancer in her mother.  ROS:   Please see the history of present illness.     All other systems reviewed and are negative.  EKGs/Labs/Other Studies Reviewed:    The following studies were reviewed  today:   EKG:  EKG is ordered today.  The ekg ordered today demonstrates normal sinus rhythm, rate 70, poor R wave progression  Recent Labs: 07/24/2019: Hemoglobin 12.2; Platelets 225.0 01/14/2020: BUN 15; Creat 0.71; Potassium 4.6; Sodium 139  Recent Lipid Panel    Component Value Date/Time   CHOL 204 (H) 01/14/2020 0853   TRIG 161 (H) 01/14/2020 0853   HDL 52 01/14/2020 0853   CHOLHDL 3.9 01/14/2020 0853   VLDL 29.0 04/02/2013 0852   LDLCALC 124 (H) 01/14/2020 0853    TTE 03/06/19: 1. Left ventricular ejection fraction, by visual estimation, is 60 to  65%. The left ventricle has normal function. There is no left ventricular  hypertrophy.  2. The left ventricle has no regional wall motion abnormalities.  3. Global right ventricle  has normal systolic function.The right  ventricular size is normal. No increase in right ventricular wall  thickness.  4. Left atrial size was normal.  5. Right atrial size was normal.  6. Presence of pericardial fat pad.  7. Trivial pericardial effusion is present.  8. The mitral valve is grossly normal. Mild mitral valve regurgitation.  9. The tricuspid valve is grossly normal. Tricuspid valve regurgitation  is trivial.  10. The aortic valve is tricuspid. Aortic valve regurgitation is not  visualized. No evidence of aortic valve sclerosis or stenosis.  11. The pulmonic valve was grossly normal. Pulmonic valve regurgitation is  trivial.  12. Normal pulmonary artery systolic pressure.  13. The tricuspid regurgitant velocity is 1.77 m/s, and with an assumed  right atrial pressure of 3 mmHg, the estimated right ventricular systolic  pressure is normal at 15.6 mmHg.  14. The inferior vena cava is normal in size with greater than 50%  respiratory variability, suggesting right atrial pressure of 3 mmHg.  15. No prior Echocardiogram.   Physical Exam:    VS:  BP 100/70   Pulse 70   Ht 5\' 8"  (1.727 m)   Wt 226 lb (102.5 kg)   BMI 34.36 kg/m     Wt Readings from Last 3 Encounters:  05/30/20 226 lb (102.5 kg)  01/14/20 218 lb (98.9 kg)  12/12/19 210 lb (95.3 kg)     GEN:  Well nourished, well developed in no acute distress HEENT: Normal NECK: No JVD; No carotid bruits LYMPHATICS: No lymphadenopathy CARDIAC: RRR, no murmurs, rubs, gallops RESPIRATORY:  Clear to auscultation without rales, wheezing or rhonchi  ABDOMEN: Soft, non-tender, non-distended MUSCULOSKELETAL:  No edema; No deformity  SKIN: Warm and dry NEUROLOGIC:  Alert and oriented x 3 PSYCHIATRIC:  Normal affect   ASSESSMENT:    1. Nonspecific abnormal electrocardiogram (ECG) (EKG)   2. Palpitations    PLAN:    In order of problems listed above:  EKG abnormalities: Poor R wave progression.  TTE shows no  abnormalities  Palpitations: Description consistent with likely PACs or PVCs, as described feeling like heart skips a beat, last for a second and resolves.  Has not been occurring recently.  If occurring more frequently, can evaluate with cardiac monitor to quantify burden of ectopy  OSA: Continue CPAP  RTC as needed  Medication Adjustments/Labs and Tests Ordered: Current medicines are reviewed at length with the patient today.  Concerns regarding medicines are outlined above.  Orders Placed This Encounter  Procedures  . EKG 12-Lead   No orders of the defined types were placed in this encounter.   Patient Instructions  Medication Instructions:  Your physician recommends that you continue on  your current medications as directed. Please refer to the Current Medication list given to you today.  Follow-Up: At [facility], you and your health needs are our priority.  As part of our continuing mission to provide you with exceptional heart care, we have created designated Provider Care Teams.  These Care Teams include your primary Cardiologist (physician) and Advanced Practice Providers (APPs -  Physician Assistants and Nurse Practitioners) who all work together to provide you with the care you need, when you need it.  We recommend signing up for the patient portal called "MyChart".  Sign up information is provided on this After Visit Summary.  MyChart is used to connect with patients for Virtual Visits (Telemedicine).  Patients are able to view lab/test results, encounter notes, upcoming appointments, etc.  Non-urgent messages can be sent to your provider as well.   To learn more about what you can do with MyChart, go to [email or web address].    Your next appointment:   AS NEEDED with Dr. Bjorn Pippin       Signed, Little Ishikawa, MD  05/30/2020 8:38 AM    [facility]

## 2020-05-30 ENCOUNTER — Encounter: Payer: Self-pay | Admitting: Cardiology

## 2020-05-30 ENCOUNTER — Ambulatory Visit: Payer: 59 | Admitting: Cardiology

## 2020-05-30 ENCOUNTER — Other Ambulatory Visit: Payer: Self-pay

## 2020-05-30 VITALS — BP 100/70 | HR 70 | Ht 68.0 in | Wt 226.0 lb

## 2020-05-30 DIAGNOSIS — R9431 Abnormal electrocardiogram [ECG] [EKG]: Secondary | ICD-10-CM | POA: Diagnosis not present

## 2020-05-30 DIAGNOSIS — R002 Palpitations: Secondary | ICD-10-CM

## 2020-05-30 NOTE — Patient Instructions (Signed)
Medication Instructions:  Your physician recommends that you continue on your current medications as directed. Please refer to the Current Medication list given to you today.  Follow-Up: At [facility], you and your health needs are our priority.  As part of our continuing mission to provide you with exceptional heart care, we have created designated Provider Care Teams.  These Care Teams include your primary Cardiologist (physician) and Advanced Practice Providers (APPs -  Physician Assistants and Nurse Practitioners) who all work together to provide you with the care you need, when you need it.  We recommend signing up for the patient portal called "MyChart".  Sign up information is provided on this After Visit Summary.  MyChart is used to connect with patients for Virtual Visits (Telemedicine).  Patients are able to view lab/test results, encounter notes, upcoming appointments, etc.  Non-urgent messages can be sent to your provider as well.   To learn more about what you can do with MyChart, go to [email or web address].    Your next appointment:   AS NEEDED with Dr. Schumann 

## 2020-07-30 ENCOUNTER — Encounter: Payer: Self-pay | Admitting: Family Medicine

## 2020-12-30 ENCOUNTER — Emergency Department (INDEPENDENT_AMBULATORY_CARE_PROVIDER_SITE_OTHER)
Admission: RE | Admit: 2020-12-30 | Discharge: 2020-12-30 | Disposition: A | Discharge status: Home / Self Care | Payer: 59 | Source: Ambulatory Visit | Attending: Family Medicine | Admitting: Family Medicine

## 2020-12-30 ENCOUNTER — Other Ambulatory Visit: Payer: Self-pay

## 2020-12-30 VITALS — BP 140/85 | HR 67 | Temp 98.2°F | Resp 16

## 2020-12-30 DIAGNOSIS — R109 Unspecified abdominal pain: Secondary | ICD-10-CM | POA: Diagnosis not present

## 2020-12-30 DIAGNOSIS — K760 Fatty (change of) liver, not elsewhere classified: Secondary | ICD-10-CM | POA: Insufficient documentation

## 2020-12-30 DIAGNOSIS — M545 Low back pain, unspecified: Secondary | ICD-10-CM

## 2020-12-30 DIAGNOSIS — R1011 Right upper quadrant pain: Secondary | ICD-10-CM

## 2020-12-30 LAB — POCT URINALYSIS DIP (MANUAL ENTRY)
Bilirubin, UA: NEGATIVE
Glucose, UA: NEGATIVE mg/dL
Ketones, POC UA: NEGATIVE mg/dL
Nitrite, UA: NEGATIVE
Protein Ur, POC: NEGATIVE mg/dL
Spec Grav, UA: 1.015 (ref 1.010–1.025)
Urobilinogen, UA: 0.2 E.U./dL
pH, UA: 5.5 (ref 5.0–8.0)

## 2020-12-30 MED ORDER — TRAMADOL-ACETAMINOPHEN 37.5-325 MG PO TABS: 1.0000 | ORAL_TABLET | Freq: Four times a day (QID) | ORAL | 0 refills | Status: DC | PRN

## 2020-12-30 MED ORDER — CIPROFLOXACIN HCL 500 MG PO TABS: 500.0000 mg | ORAL_TABLET | Freq: Two times a day (BID) | ORAL | 0 refills | Status: DC

## 2020-12-30 NOTE — ED Provider Notes (Signed)
[facility]    CSN: [id number] Arrival date & time: 12/30/20  1658      History   Chief Complaint Chief Complaint  Patient presents with   Back Pain    HPI Carmen Knight is a 55 y.o. female.   HPI  Patient has had intermittent right flank pain for 2 weeks.  She states that her right upper abdomen radiating to through to her right back.  She thinks it is worse with meals.  She thinks it may be gallbladder disease and made appointment with her PCP.  Unfortunately cannot be seen until Friday.  Today it became worse.  Pain became more constant.  She has intermittent nausea but no vomiting.  No sweats chills or fever.  She is drinking more water and urinating more but does not associate this with a urinary tract infection.  Has never had pyelonephritis or kidney stones.  Has had no abdominal surgeries.  Past Medical History:  Diagnosis Date   Sleep apnea     Patient Active Problem List   Diagnosis Date Noted   Abnormal EKG 01/03/2019   Abdominal pain, chronic, right upper quadrant 12/22/2015   Bloating 12/22/2015   Encounter for screening colonoscopy 12/22/2015   OSA (obstructive sleep apnea) 10/23/2015   Hypersomnia 07/21/2015   Cough 07/21/2015   Acute reaction to stress 04/25/2015   Obesity, Class I, BMI 30-34.9 04/02/2013   GERD (gastroesophageal reflux disease) 02/21/2013    Past Surgical History:  Procedure Laterality Date   ABDOMINAL HYSTERECTOMY     FOOT SURGERY      OB History   No obstetric history on file.      Home Medications    Prior to Admission medications   Medication Sig Start Date End Date Taking? Authorizing Provider  ciprofloxacin (CIPRO) 500 MG tablet Take 1 tablet (500 mg total) by mouth 2 (two) times daily. 12/30/20  Yes Eustace Moore, MD  traMADol-acetaminophen (ULTRACET) 37.5-325 MG tablet Take 1-2 tablets by mouth every 6 (six) hours as needed. 12/30/20  Yes Eustace Moore, MD  albuterol (PROVENTIL HFA;VENTOLIN  HFA) 108 [phone] Base) MCG/ACT inhaler Take 2 puffs by mouth every 6 hours as needed 08/19/17   Burchette, Elberta Fortis, MD  fexofenadine (ALLEGRA) 180 MG tablet Take 180 mg by mouth as needed for allergies or rhinitis. Reported on 07/02/2015    [provider]  naproxen (NAPROSYN) 500 MG tablet Take 500 mg by mouth 2 (two) times daily. 01/18/20   [provider]    Family History Family History  Problem Relation Age of Onset   Prostate cancer Father    Stomach cancer Mother        passed away with stomach cancer     Social History Social History   Tobacco Use   Smoking status: Never   Smokeless tobacco: Never  Substance Use Topics   Alcohol use: Yes    Alcohol/week: 2.0 standard drinks    Types: 2 Glasses of wine per week   Drug use: No     Allergies   Patient has no known allergies.   Review of Systems Review of Systems See HPI  Physical Exam Triage Vital Signs ED Triage Vitals  Enc Vitals Group     BP 12/30/20 1711 140/85     Pulse Rate 12/30/20 1711 67     Resp 12/30/20 1711 16     Temp 12/30/20 1711 98.2 F (36.8 C)     Temp Source 12/30/20 1711 Oral  SpO2 12/30/20 1711 97 %     Weight --      Height --      Head Circumference --      Peak Flow --      Pain Score 12/30/20 1710 6     Pain Loc --      Pain Edu? --      Excl. in GC? --    No data found.  Updated Vital Signs BP 140/85 (BP Location: Left Arm)   Pulse 67   Temp 98.2 F (36.8 C) (Oral)   Resp 16   SpO2 97%      Physical Exam Constitutional:      General: She is not in acute distress.    Appearance: She is well-developed.     Comments: Overweight.  Appears uncomfortable  HENT:     Head: Normocephalic and atraumatic.     Right Ear: Tympanic membrane and ear canal normal.     Left Ear: Tympanic membrane and ear canal normal.     Nose: Nose normal. No congestion.     Mouth/Throat:     Mouth: Mucous membranes are moist.     Pharynx: No posterior oropharyngeal erythema.   Eyes:     Conjunctiva/sclera: Conjunctivae normal.     Pupils: Pupils are equal, round, and reactive to light.  Cardiovascular:     Rate and Rhythm: Normal rate and regular rhythm.     Heart sounds: Normal heart sounds.  Pulmonary:     Effort: Pulmonary effort is normal. No respiratory distress.     Breath sounds: Normal breath sounds.  Chest:     Chest wall: No tenderness.  Abdominal:     General: There is no distension.     Palpations: Abdomen is soft.     Tenderness: There is abdominal tenderness. There is no right CVA tenderness or left CVA tenderness.     Comments: No real CVA tenderness but admits that the right CVA elicits more pain than the left.  Abdomen soft.  No guarding or rebound.  There is tenderness to deep palpation in the right upper quadrant.  No acute abdominal findings.  No organomegaly  Musculoskeletal:        General: Normal range of motion.     Cervical back: Normal range of motion.  Lymphadenopathy:     Cervical: No cervical adenopathy.  Skin:    General: Skin is warm and dry.  Neurological:     Mental Status: She is alert.  Psychiatric:        Mood and Affect: Mood normal.        Behavior: Behavior normal.     UC Treatments / Results  Labs (all labs ordered are listed, but only abnormal results are displayed) Labs Reviewed  POCT URINALYSIS DIP (MANUAL ENTRY) - Abnormal; Notable for the following components:      Result Value   Color, UA light yellow (*)    Clarity, UA cloudy (*)    Blood, UA trace-intact (*)    Leukocytes, UA Small (1+) (*)    All other components within normal limits  URINE CULTURE  COMPLETE METABOLIC PANEL WITH GFR  CBC WITH DIFFERENTIAL/PLATELET  LIPASE    EKG   Radiology No results found.  Procedures Procedures (including critical care time)  Medications Ordered in UC Medications - No data to display  Initial Impression / Assessment and Plan / UC Course  I have reviewed the triage vital signs and the nursing  notes.  Pertinent labs & imaging results that were available during my care of the patient were reviewed by me and considered in my medical decision making (see chart for details).     Patient endorses that the right upper quadrant pain has characteristically been worse after meals.  Now it is constant.  She does not have any symptoms of an acute abdomen, but does have an abnormal urinalysis.  I am uncertain whether she has right upper quadrant pain from gallbladder disease or possibly an acute pyelonephritis.  I will cover her with antibiotics.  Culture of the urine.  Obtain lab studies.  Tomorrow she will come back for an ultrasound of her abdomen.  All of this is in preparation for her visit with her PCP on Friday.  She understands to go to the ER if she is worse instead of better. Final Clinical Impressions(s) / UC Diagnoses   Final diagnoses:  Acute right-sided low back pain without sciatica  Right flank pain     Discharge Instructions      I am sending a blood test and urine culture I have ordered an ultrasound for tomorrow Call here at 0800 to find out what time to come Take the antibiotic 2 x a day Take ultracet if needed for pain See your doctor on Friday GO TO ER if you become severely worse   ED Prescriptions     Medication Sig Dispense Auth. Provider   ciprofloxacin (CIPRO) 500 MG tablet Take 1 tablet (500 mg total) by mouth 2 (two) times daily. 14 tablet Eustace Moore, MD   traMADol-acetaminophen (ULTRACET) 37.5-325 MG tablet Take 1-2 tablets by mouth every 6 (six) hours as needed. 20 tablet Eustace Moore, MD      I have reviewed the PDMP during this encounter.   Eustace Moore, MD 12/30/20 Norberta Keens

## 2020-12-30 NOTE — Discharge Instructions (Signed)
I am sending a blood test and urine culture I have ordered an ultrasound for tomorrow Call here at 0800 to find out what time to come Take the antibiotic 2 x a day Take ultracet if needed for pain See your doctor on Friday GO TO ER if you become severely worse

## 2020-12-30 NOTE — ED Triage Notes (Signed)
Patient presents to Urgent Care with complaints of right flank pain since 2 weeks. Patient reports pain has been intermittent but now is constant. Pain is mainly in back. Has increased water intake. Has taken Ibuprofen for pain.

## 2020-12-31 ENCOUNTER — Other Ambulatory Visit: Payer: 59

## 2020-12-31 ENCOUNTER — Telehealth: Payer: Self-pay | Admitting: Emergency Medicine

## 2020-12-31 ENCOUNTER — Ambulatory Visit (INDEPENDENT_AMBULATORY_CARE_PROVIDER_SITE_OTHER): Payer: 59

## 2020-12-31 DIAGNOSIS — R1011 Right upper quadrant pain: Secondary | ICD-10-CM | POA: Diagnosis not present

## 2020-12-31 LAB — COMPLETE METABOLIC PANEL WITH GFR
AG Ratio: 1.8 (calc) (ref 1.0–2.5)
ALT: 15 U/L (ref 6–29)
AST: 17 U/L (ref 10–35)
Albumin: 4.4 g/dL (ref 3.6–5.1)
Alkaline phosphatase (APISO): 97 U/L (ref 37–153)
BUN: 15 mg/dL (ref 7–25)
CO2: 25 mmol/L (ref 20–32)
Calcium: 9.5 mg/dL (ref 8.6–10.4)
Chloride: 103 mmol/L (ref 98–110)
Creat: 0.71 mg/dL (ref 0.50–1.03)
Globulin: 2.5 g/dL (calc) (ref 1.9–3.7)
Glucose, Bld: 105 mg/dL — ABNORMAL HIGH (ref 65–99)
Potassium: 4.1 mmol/L (ref 3.5–5.3)
Sodium: 139 mmol/L (ref 135–146)
Total Bilirubin: 0.3 mg/dL (ref 0.2–1.2)
Total Protein: 6.9 g/dL (ref 6.1–8.1)
eGFR: 100 mL/min/{1.73_m2} (ref >=60)

## 2020-12-31 LAB — CBC WITH DIFFERENTIAL/PLATELET
Absolute Monocytes: 369 cells/uL (ref 200–950)
Basophils Absolute: 10 cells/uL (ref 0–200)
Basophils Relative: 0.2 %
Eosinophils Absolute: 42 cells/uL (ref 15–500)
Eosinophils Relative: 0.8 %
HCT: 38.7 % (ref 35.0–45.0)
Hemoglobin: 12.9 g/dL (ref 11.7–15.5)
Lymphs Abs: 1721 cells/uL (ref 850–3900)
MCH: 30.7 pg (ref 27.0–33.0)
MCHC: 33.3 g/dL (ref 32.0–36.0)
MCV: 92.1 fL (ref 80.0–100.0)
MPV: 11.7 fL (ref 7.5–12.5)
Monocytes Relative: 7.1 %
Neutro Abs: 3058 cells/uL (ref 1500–7800)
Neutrophils Relative %: 58.8 %
Platelets: 277 10*3/uL (ref 140–400)
RBC: 4.2 10*6/uL (ref 3.80–5.10)
RDW: 13 % (ref 11.0–15.0)
Total Lymphocyte: 33.1 %
WBC: 5.2 10*3/uL (ref 3.8–10.8)

## 2020-12-31 LAB — LIPASE: Lipase: 47 U/L (ref 7–60)

## 2020-12-31 NOTE — Progress Notes (Signed)
Patient called. [place] results reviewed by provider here today. No acute findings as reported. Pt to follow up with PCP - Urine culture pending - Pt states she has an appointment w/ her PCP on Friday

## 2020-12-31 NOTE — Telephone Encounter (Signed)
Call from Clydie Braun regarding [place] appointment for today. Pt placed on hold - RN followed up with Sharma Covert in radiology. Pt is scheduled for 0930 this morning. Pt has  been NPO for 6 hours. Information relayed to patient. Ok for pt to check in at radiology - does not need to return to Urgent Care prior to check in.

## 2021-01-01 ENCOUNTER — Other Ambulatory Visit: Payer: Self-pay

## 2021-01-01 LAB — URINE CULTURE
MICRO NUMBER:: 12490036
Result:: NO GROWTH
SPECIMEN QUALITY:: ADEQUATE

## 2021-01-02 ENCOUNTER — Ambulatory Visit (INDEPENDENT_AMBULATORY_CARE_PROVIDER_SITE_OTHER): Payer: 59 | Admitting: Family Medicine

## 2021-01-02 ENCOUNTER — Encounter: Payer: Self-pay | Admitting: Family Medicine

## 2021-01-02 VITALS — BP 120/80 | HR 67 | Temp 98.0°F | Resp 16 | Ht 68.0 in | Wt 224.8 lb

## 2021-01-02 DIAGNOSIS — R1011 Right upper quadrant pain: Secondary | ICD-10-CM | POA: Diagnosis not present

## 2021-01-02 DIAGNOSIS — R109 Unspecified abdominal pain: Secondary | ICD-10-CM

## 2021-01-02 DIAGNOSIS — R3129 Other microscopic hematuria: Secondary | ICD-10-CM

## 2021-01-02 DIAGNOSIS — K760 Fatty (change of) liver, not elsewhere classified: Secondary | ICD-10-CM | POA: Diagnosis not present

## 2021-01-02 LAB — URINALYSIS, ROUTINE W REFLEX MICROSCOPIC
Bilirubin Urine: NEGATIVE
Hgb urine dipstick: NEGATIVE
Ketones, ur: NEGATIVE
Leukocytes,Ua: NEGATIVE
Nitrite: NEGATIVE
RBC / HPF: NONE SEEN (ref 0–?)
Specific Gravity, Urine: 1.015 (ref 1.000–1.030)
Total Protein, Urine: NEGATIVE
Urine Glucose: NEGATIVE
Urobilinogen, UA: 0.2 (ref 0.0–1.0)
WBC, UA: NONE SEEN (ref 0–?)
pH: 5.5 (ref 5.0–8.0)

## 2021-01-02 NOTE — Patient Instructions (Addendum)
A few things to remember from today's visit:  Right flank pain - Plan: Urinalysis, Routine w reflex microscopic  Microscopic hematuria - Plan: Urinalysis, Routine w reflex microscopic  Right upper quadrant abdominal pain - Plan: CT RENAL ABD W/WO  If you need refills please call your pharmacy. Do not use My Chart to request refills or for acute issues that need immediate attention.   Continue monitoring for new symptoms. Antibiotic can be discontinued. CT will be arranged. Continue Naproxen 500 mg 2 times daily as needed.  Please be sure medication list is accurate. If a new problem present, please set up appointment sooner than planned today.

## 2021-01-02 NOTE — Progress Notes (Signed)
Chief Complaint  Patient presents with   Abdominal Pain    Patient complains of right side abdominal pain, x1 month, Patient was seen in Ed on 10/11,  HPI: [name] is a 55 y.o. female, who is here today c/u RUQ abdominal pain as described above.   RUQ dull/achy pain,under rib cage, it feels "deep",radiated to right side of the back. Intermittent for a month and constant for the past few days. She tried to increase water intake and seems to be alleviated increasing physical activity. Last week pain got worse in regard to intensity and frequency.  Evaluated in the ED on 12/30/20 for same problem. RUQ abdominal US: 1. No acute findings. Specifically, no gallstones or findings to suggest an acute cholecystitis at this time. 2. Increased hepatic echogenicity, concerning for hepatic steatosis.  Lab Results  Component Value Date   ALT 15 12/30/2020   AST 17 12/30/2020   ALKPHOS 90 11/17/2018   BILITOT 0.3 12/30/2020   Lab Results  Component Value Date   WBC 5.2 12/30/2020   HGB 12.9 12/30/2020   HCT 38.7 12/30/2020   MCV 92.1 12/30/2020   PLT 277 12/30/2020   Urine dipsticks showed trace of blood and small amount of leukocytes. Started on ciprofloxacin to treat for UTI. Ucx no growth.   Lab Results  Component Value Date   CREATININE 0.71 12/30/2020   BUN 15 12/30/2020   NA 139 12/30/2020   K 4.1 12/30/2020   CL 103 12/30/2020   CO2 25 12/30/2020   At this time pain is 3/10 and can be 6-7/10. Exacerbated by movement. She is on abx and pain medication, so pain has improved.  No hx of nephrolithiasis. Negative for fever,chills,changes in appetite,night sweats,abnormal wt loss, N/V,urinary symptoms.  Review of Systems  Constitutional:  Negative for activity change and fatigue.  Respiratory:  Negative for cough, shortness of breath and wheezing.   Gastrointestinal:  Negative for blood in stool.       No changes in bowel habits.  Endocrine: Negative for  cold intolerance and heat intolerance.  Genitourinary:  Negative for decreased urine volume, dysuria and hematuria.  Musculoskeletal:  Negative for gait problem.  Skin:  Negative for color change and rash.  Neurological:  Negative for syncope and weakness.  Hematological:  Negative for adenopathy. Does not bruise/bleed easily.  Rest of ROS, see pertinent positives sand negatives in HPI  Current Outpatient Medications on File Prior to Visit  Medication Sig Dispense Refill   albuterol (PROVENTIL HFA;VENTOLIN HFA) 108 (90 Base) MCG/ACT inhaler Take 2 puffs by mouth every 6 hours as needed 8.5 Inhaler 0   ciprofloxacin (CIPRO) 500 MG tablet Take 1 tablet (500 mg total) by mouth 2 (two) times daily. 14 tablet 0   fexofenadine (ALLEGRA) 180 MG tablet Take 180 mg by mouth as needed for allergies or rhinitis. Reported on 07/02/2015     naproxen (NAPROSYN) 500 MG tablet Take 500 mg by mouth 2 (two) times daily.     traMADol-acetaminophen (ULTRACET) 37.5-325 MG tablet Take 1-2 tablets by mouth every 6 (six) hours as needed. 20 tablet 0   No current facility-administered medications on file prior to visit.   Past Medical History:  Diagnosis Date   Sleep apnea    No Known Allergies  Social History   Socioeconomic History   Marital status: Married    Spouse name: Not on file   Number of children: Not on file   Years of education: Not on  file   Highest education level: Not on file  Occupational History   Not on file  Tobacco Use   Smoking status: Never   Smokeless tobacco: Never  Substance and Sexual Activity   Alcohol use: Yes    Alcohol/week: 2.0 standard drinks    Types: 2 Glasses of wine per week   Drug use: No   Sexual activity: Not on file  Other Topics Concern   Not on file  Social History Narrative   Not on file   Social Determinants of Health   Financial Resource Strain: Not on file  Food Insecurity: Not on file  Transportation Needs: Not on file  Physical Activity: Not  on file  Stress: Not on file  Social Connections: Not on file   Vitals:   01/02/21 1138  BP: 120/80  Pulse: 67  Resp: 16  Temp: 98 F (36.7 C)  SpO2: 96%   Body mass index is 34.18 kg/m.  Physical Exam Vitals and nursing note reviewed.  Constitutional:      General: She is not in acute distress.    Appearance: She is well-developed. She is not ill-appearing.  HENT:     Head: Normocephalic and atraumatic.  Eyes:     General: No scleral icterus.    Conjunctiva/sclera: Conjunctivae normal.  Cardiovascular:     Rate and Rhythm: Normal rate and regular rhythm.     Heart sounds: No murmur heard. Pulmonary:     Effort: Pulmonary effort is normal. No respiratory distress.     Breath sounds: Normal breath sounds.  Chest:    Abdominal:     General: Bowel sounds are normal. There is no distension.     Palpations: Abdomen is soft. There is no hepatomegaly or mass.     Tenderness: There is no abdominal tenderness.  Lymphadenopathy:     Cervical: No cervical adenopathy.     Upper Body:     Right upper body: No supraclavicular adenopathy.     Left upper body: No supraclavicular adenopathy.  Skin:    General: Skin is warm.     Findings: No erythema or rash.  Neurological:     Mental Status: She is alert and oriented to person, place, and time.  Psychiatric:     Comments: Well groomed, good eye contact.   ASSESSMENT AND PLAN:  [name] was seen today for ED follow up for RUQ abdominal pain.  Orders Placed This Encounter  Procedures   CT RENAL ABD W/WO   Urinalysis, Routine w reflex microscopic   Microscopic hematuria We discussed possible causes. Ucx negative for bacteria growth and no associated urinary symptoms, so she can d/c abx. UA will be repeated today, further recommendations according to UA results.  Right flank pain -     Urinalysis, Routine w reflex microscopic  Right upper quadrant abdominal pain Reviewed differential Dx's. Pain is getting  worse, so will arrange for right sided abdomen/renal CT to evaluate for renal lesions/masses/nephrolithiasis. Clearly instructed about warning signs.  Hepatic steatosis Educated about Dx and treatment options. Low fat diet and wt loss reccommended.  Return if symptoms worsen or fail to improve.   Akil Hoos G. [place], MD  [facility]. [place] office.

## 2021-01-05 ENCOUNTER — Encounter: Payer: Self-pay | Admitting: Family Medicine

## 2021-01-09 ENCOUNTER — Encounter: Payer: Self-pay | Admitting: Physician Assistant

## 2021-01-14 ENCOUNTER — Inpatient Hospital Stay: Admission: RE | Admit: 2021-01-14 | Payer: 59 | Source: Ambulatory Visit

## 2021-01-14 ENCOUNTER — Other Ambulatory Visit: Payer: Self-pay

## 2021-01-14 ENCOUNTER — Ambulatory Visit
Admission: EM | Admit: 2021-01-14 | Discharge: 2021-01-14 | Disposition: A | Discharge status: Home / Self Care | Payer: 59 | Attending: Emergency Medicine | Admitting: Emergency Medicine

## 2021-01-14 DIAGNOSIS — R140 Abdominal distension (gaseous): Secondary | ICD-10-CM

## 2021-01-14 DIAGNOSIS — T781XXA Other adverse food reactions, not elsewhere classified, initial encounter: Secondary | ICD-10-CM

## 2021-01-14 DIAGNOSIS — R1011 Right upper quadrant pain: Secondary | ICD-10-CM

## 2021-01-14 DIAGNOSIS — E739 Lactose intolerance, unspecified: Secondary | ICD-10-CM

## 2021-01-14 MED ORDER — LANSOPRAZOLE 30 MG PO CPDR: 30.0000 mg | DELAYED_RELEASE_CAPSULE | Freq: Every day | ORAL | 0 refills | Status: DC

## 2021-01-14 NOTE — ED Triage Notes (Signed)
1 mo h/o RUQ abdominal pain. Pt has been worked up at urgent care and w/PCP for the same sxs. Pt was started on cipro by UC provider,on the third day, her PCP advised her to stop taking the meds.  Onset yesterday of a new feeling light headed and dizziness along with abdominal pain. Has been taking naproxen and notes "horrible" burning sensation that crosses around her stomach. Also c/o increased gas. Denies v/d and constipation but notes light color almost yellow stool. Nothing seems to make sxs better or worse.  Appt scheduled w/gastro next month.

## 2021-01-14 NOTE — ED Provider Notes (Signed)
[facility]    CSN: [id number] Arrival date & time: 01/14/21  1315      History   Chief Complaint Chief Complaint  Patient presents with   Abdominal Pain   Dizziness    HPI Carmen Knight is a 55 y.o. female.   Patient presents complaining of a 1 month history of abdominal pain states she has been evaluated previously at the urgent care in [place] as well as her primary care physician.  Patient states she has had a right upper quadrant ultrasound which was unrevealing other than presence of hepatic steatosis.  Patient is morbidly obese.  EMR was reviewed by me.  Patient was seen on 10/12 at the [facility] urgent care clinic where she was treated for presumed urinary tract infection secondary to her complaint of right sided flank pain, antibiotic prescription was provided.  Patient states she was seen shortly thereafter by her primary care physician who advised her to discontinue the antibiotics at that time due to lack of justification for continued therapy as she was not feeling any better.  Upon further review of her EMR, patient has had the similar right upper quadrant pain symptoms along with bloating since 2017, per encounter note from a visit with her PCP that same year, her mother died from stomach cancer at age 85 which, according to the note, was unexpected by patient.  Also in 2017, patient was seen by gastroenterologist, colonoscopy was performed and unremarkable.  Patient states that she also took some naproxen a few days ago which caused her very intense pain, pointing to her epigastric area and right upper quadrant.  She also had 2 episodes of dizziness, once last night and once again this morning, denies nausea, fear of falling, loss of balance, stumbling, states she has not had episodes like this in the past but states they also were not that severe.    Without being prompted, patient states that she has had a "sensitive stomach" for many years and has been  dealing with right upper quadrant pain for "longer than she can remember".  Patient also reports lactose intolerance, states she avoids all dairy, even ice cream. Patient also adds that she has been complaining about having a sensitive stomach for "years" and has reached a point that she started complaining about it and just wants to find out what is causing it.  Patient states that the right upper quadrant pain is sometimes in the front, sometimes in the back, varies in location, duration, intensity and frequency, sometimes is intense, sometimes is mild, sometimes is not present at all, sometimes is accompanied by intense bloating, sometimes not.  Patient states she does not know of any other foods that she is sensitive to nor has she attempted to keep food diary to tease this out.  Patient states that she recalls her mother telling her years ago that she to had a very sensitive stomach and there were a lot of foods that she did not tolerate.  Patient states that given her symptoms of epigastric/right upper quadrant pain and dizziness, she wanted to be seen sooner than later.  Patient states she has upcoming appointment with another gastroenterologist on January 28, 2021.    The history is provided by the patient.   Past Medical History:  Diagnosis Date   Sleep apnea     Patient Active Problem List   Diagnosis Date Noted   Hepatic steatosis 12/30/2020   Abnormal EKG 01/03/2019   Abdominal pain, chronic, right upper  quadrant 12/22/2015   Bloating 12/22/2015   Encounter for screening colonoscopy 12/22/2015   OSA (obstructive sleep apnea) 10/23/2015   Hypersomnia 07/21/2015   Cough 07/21/2015   Acute reaction to stress 04/25/2015   Obesity, Class I, BMI 30-34.9 04/02/2013   GERD (gastroesophageal reflux disease) 02/21/2013    Past Surgical History:  Procedure Laterality Date   ABDOMINAL HYSTERECTOMY     FOOT SURGERY      OB History   No obstetric history on file.      Home  Medications    Prior to Admission medications   Medication Sig Start Date End Date Taking? Authorizing Provider  lansoprazole (PREVACID) 30 MG capsule Take 1 capsule (30 mg total) by mouth daily at 12 noon. 01/14/21 02/13/21 Yes Theadora Rama Scales, PA-C  albuterol (PROVENTIL HFA;VENTOLIN HFA) 108 (90 Base) MCG/ACT inhaler Take 2 puffs by mouth every 6 hours as needed 08/19/17   Burchette, Elberta Fortis, MD  ciprofloxacin (CIPRO) 500 MG tablet Take 1 tablet (500 mg total) by mouth 2 (two) times daily. 12/30/20   Eustace Moore, MD  fexofenadine (ALLEGRA) 180 MG tablet Take 180 mg by mouth as needed for allergies or rhinitis. Reported on 07/02/2015    [provider]  naproxen (NAPROSYN) 500 MG tablet Take 500 mg by mouth 2 (two) times daily. 01/18/20   [provider]  traMADol-acetaminophen (ULTRACET) 37.5-325 MG tablet Take 1-2 tablets by mouth every 6 (six) hours as needed. 12/30/20   Eustace Moore, MD    Family History Family History  Problem Relation Age of Onset   Prostate cancer Father    Stomach cancer Mother        passed away with stomach cancer     Social History Social History   Tobacco Use   Smoking status: Never   Smokeless tobacco: Never  Substance Use Topics   Alcohol use: Yes    Alcohol/week: 2.0 standard drinks    Types: 2 Glasses of wine per week   Drug use: No     Allergies   Patient has no known allergies.   Review of Systems Review of Systems Pertinent findings noted in history of present illness.    Physical Exam Triage Vital Signs ED Triage Vitals  Enc Vitals Group     BP      Pulse      Resp      Temp      Temp src      SpO2      Weight      Height      Head Circumference      Peak Flow      Pain Score      Pain Loc      Pain Edu?      Excl. in GC?    No data found.  Updated Vital Signs BP 133/86 (BP Location: Left Arm)   Pulse 69   Temp 98.1 F (36.7 C) (Oral)   Resp 18   SpO2 97%   Visual  Acuity Right Eye Distance:   Left Eye Distance:   Bilateral Distance:    Right Eye Near:   Left Eye Near:    Bilateral Near:     Physical Exam Vitals and nursing note reviewed.  Constitutional:      General: She is not in acute distress.    Appearance: Normal appearance. She is not ill-appearing.  HENT:     Head: Normocephalic and atraumatic.  Eyes:  General: Lids are normal.        Right eye: No discharge.        Left eye: No discharge.     Extraocular Movements: Extraocular movements intact.     Conjunctiva/sclera: Conjunctivae normal.     Right eye: Right conjunctiva is not injected.     Left eye: Left conjunctiva is not injected.  Neck:     Trachea: Trachea and phonation normal.  Cardiovascular:     Rate and Rhythm: Normal rate and regular rhythm.     Pulses: Normal pulses.     Heart sounds: Normal heart sounds. No murmur heard.   No friction rub. No gallop.  Pulmonary:     Effort: Pulmonary effort is normal. No accessory muscle usage, prolonged expiration or respiratory distress.     Breath sounds: Normal breath sounds. No stridor, decreased air movement or transmitted upper airway sounds. No decreased breath sounds, wheezing, rhonchi or rales.  Chest:     Chest wall: No tenderness.  Abdominal:     General: Abdomen is flat. Bowel sounds are normal. There is no distension.     Palpations: Abdomen is soft.     Tenderness: There is abdominal tenderness in the right upper quadrant. There is no right CVA tenderness or left CVA tenderness.     Hernia: No hernia is present.  Musculoskeletal:        General: Normal range of motion.     Cervical back: Normal range of motion and neck supple. Normal range of motion.  Lymphadenopathy:     Cervical: No cervical adenopathy.  Skin:    General: Skin is warm and dry.     Findings: No erythema or rash.  Neurological:     General: No focal deficit present.     Mental Status: She is alert and oriented to person, place, and  time.  Psychiatric:        Mood and Affect: Mood normal.        Behavior: Behavior normal.     UC Treatments / Results  Labs (all labs ordered are listed, but only abnormal results are displayed) Labs Reviewed - No data to display  EKG   Radiology No results found.  Procedures Procedures (including critical care time)  Medications Ordered in UC Medications - No data to display  Initial Impression / Assessment and Plan / UC Course  I have reviewed the triage vital signs and the nursing notes.  Pertinent labs & imaging results that were available during my care of the patient were reviewed by me and considered in my medical decision making (see chart for details).    Physical exam is unremarkable other than some reported tenderness with palpation of right upper quadrant, no CVA tenderness was appreciated with percussion.  I advised patient, as I believe she was largely aware, that there was very little I can do to provide her any relief of her symptoms at this time nor my able to perform any meaningful laboratory or imaging diagnosis in this urgent care setting.  I did recommend that she consider evaluating herself for food intolerances.  We discussed the FODMAP diet, I shared with her some of the things that I learned from the [employer] and also printed her a copy of their PDF describing how to go about evaluating her food intolerances.  We also discussed the possibility that her right upper quadrant pain with concomitant possible epigastric pain may be related to possible gastroesophageal reflux disease, possible duodenal  ulcer, that may or may not be related to possible H. pylori infection.  I shared with her the process of H. pylori breath testing and that this is a test that can be done either by her primary care physician or perhaps the gastroenterologist would want to perform an EGD and perform biopsy if there is any suspicion of H. pylori infection.  I provided  her with a prescription for Prevacid at this time, recommend that she begin taking it prior to her visit with GI so that she can let them know if this has been of any benefit.  Finally I suspect that patient is suffering from some degree of prolonged grief after the death of her mother, however I also believe that her symptoms are not psychosomatic in etiology and do deserve further medical evaluation.  Patient verbalized understanding and agreement of plan as discussed.  All questions were addressed during visit.  Please see discharge instructions below for further details of plan.   Final Clinical Impressions(s) / UC Diagnoses   Final diagnoses:  Right upper quadrant abdominal pain  Lactose intolerance  Food sensitivity with gastrointestinal symptoms  Abdominal bloating     Discharge Instructions      I am glad that you have an appointment scheduled with gastroenterology.  In the meantime, please consider beginning Prevacid 30 mg once daily to see if this addresses any upper GI symptoms that might be related to either heartburn or an H. pylori infection. Information regarding the FODMAP diet.  As I mentioned, teasing out what foods you might be sensitive to can be a complicated process and requires patience but ultimately, if you are able to identify foods that are the most offensive and avoid them you may see a significant improvement of your symptoms.  This is my best guess as to what might be going on, of course the gastroenterologist and your primary care physician know you better than I do and I am only providing you with some fresh insight that perhaps they have not.  I wish you well and your journey to find out what is causing your discomfort.  Thank you for visiting urgent care.     ED Prescriptions     Medication Sig Dispense Auth. Provider   lansoprazole (PREVACID) 30 MG capsule Take 1 capsule (30 mg total) by mouth daily at 12 noon. 30 capsule Theadora Rama Scales, PA-C       PDMP not reviewed this encounter.   Theadora Rama Scales, [place] 01/14/21 [phone]

## 2021-01-14 NOTE — Discharge Instructions (Addendum)
I am glad that you have an appointment scheduled with gastroenterology.  In the meantime, please consider beginning Prevacid 30 mg once daily to see if this addresses any upper GI symptoms that might be related to either heartburn or an H. pylori infection. Information regarding the FODMAP diet.  As I mentioned, teasing out what foods you might be sensitive to can be a complicated process and requires patience but ultimately, if you are able to identify foods that are the most offensive and avoid them you may see a significant improvement of your symptoms.  This is my best guess as to what might be going on, of course the gastroenterologist and your primary care physician know you better than I do and I am only providing you with some fresh insight that perhaps they have not.  I wish you well and your journey to find out what is causing your discomfort.  Thank you for visiting urgent care.

## 2021-01-20 ENCOUNTER — Other Ambulatory Visit: Payer: Self-pay

## 2021-01-20 ENCOUNTER — Ambulatory Visit (INDEPENDENT_AMBULATORY_CARE_PROVIDER_SITE_OTHER): Payer: 59

## 2021-01-20 ENCOUNTER — Ambulatory Visit: Payer: 59 | Admitting: Family Medicine

## 2021-01-20 ENCOUNTER — Encounter: Payer: Self-pay | Admitting: Family Medicine

## 2021-01-20 VITALS — BP 128/80 | HR 62 | Resp 16 | Ht 68.0 in | Wt 224.0 lb

## 2021-01-20 DIAGNOSIS — R0781 Pleurodynia: Secondary | ICD-10-CM

## 2021-01-20 DIAGNOSIS — R1013 Epigastric pain: Secondary | ICD-10-CM

## 2021-01-20 DIAGNOSIS — R109 Unspecified abdominal pain: Secondary | ICD-10-CM

## 2021-01-20 MED ORDER — LANSOPRAZOLE 30 MG PO CPDR: 30.0000 mg | DELAYED_RELEASE_CAPSULE | Freq: Every day | ORAL | 0 refills | Status: DC

## 2021-01-20 NOTE — Patient Instructions (Addendum)
A few things to remember from today's visit:  Costal margin pain - Plan: DG Chest 2 View  Dyspepsia - Plan: lansoprazole (PREVACID) 30 MG capsule  If you need refills please call your pharmacy. Do not use My Chart to request refills or for acute issues that need immediate attention.   Complete 6-8 week of Prevacid. Keep appt for CT and gastro. Continue avoiding foods than can aggravate pain. Daily probiotic, Align 1 cap daily.  Please be sure medication list is accurate. If a new problem present, please set up appointment sooner than planned today.

## 2021-01-20 NOTE — Progress Notes (Signed)
HPI: [name] is a 55 y.o. female, who is here today to follow on recent ED visit. She was evaluated in the ED on 12/30/20 and 01/14/21 for RUQ pain.  She localizes pain on lateral aspect of right rib cage and it radiated to RUQ and posterior rib cage. Problem has been intermittent for about 1-2 months.  12/30/20 RUQ [place]: 1. No acute findings. Specifically, no gallstones or findings to suggest an acute cholecystitis at this time. 2. Increased hepatic echogenicity, concerning for hepatic steatosis.  Last seen here in the office on 01/02/21. 12/30/20 negative for hematuria.  Lab Results  Component Value Date   CREATININE 0.71 12/30/2020   BUN 15 12/30/2020   NA 139 12/30/2020   K 4.1 12/30/2020   CL 103 12/30/2020   CO2 25 12/30/2020   Lab Results  Component Value Date   ALT 15 12/30/2020   AST 17 12/30/2020   ALKPHOS 90 11/17/2018   BILITOT 0.3 12/30/2020   Pain is intermittently,achy "bothersome" pain. Sharp pain one time, lasted seconds. Usually pain is 2-3/10. Exacerbated sometimes by certain movements.  Negative for fever,night sweats,abnormal wt loss, cough,wheezing,or SOB. She has not noted rash or skin changes on affected area.  She took Naproxen for pain, it caused epigastric burning sensation,like she was "on fire." Prevacid was started and burning sensation has improved. Gluten free diet was recommended.  She has an appt with GI on 01/27/21.  She has abdominal CT scheduled.  "A lot gas", burping and flatulence, exacerbated by any oral intake. Stool has been "light in color." She had some loose stools for a few days but back to her normal consistency. Negative for nausea,vomiting,melena,or blood in stool. She has tried simethicone.  Review of Systems  HENT:  Negative for sore throat and trouble swallowing.   Cardiovascular:  Negative for chest pain, palpitations and leg swelling.  Genitourinary:  Negative for decreased urine volume, dysuria  and hematuria.  Neurological:  Negative for syncope and weakness.  Rest see pertinent positives and negatives per HPI.  Current Outpatient Medications on File Prior to Visit  Medication Sig Dispense Refill   albuterol (PROVENTIL HFA;VENTOLIN HFA) 108 (90 Base) MCG/ACT inhaler Take 2 puffs by mouth every 6 hours as needed 8.5 Inhaler 0   fexofenadine (ALLEGRA) 180 MG tablet Take 180 mg by mouth as needed for allergies or rhinitis. Reported on 07/02/2015     No current facility-administered medications on file prior to visit.   Past Medical History:  Diagnosis Date   Sleep apnea    No Known Allergies  Social History   Socioeconomic History   Marital status: Married    Spouse name: Not on file   Number of children: Not on file   Years of education: Not on file   Highest education level: Not on file  Occupational History   Not on file  Tobacco Use   Smoking status: Never   Smokeless tobacco: Never  Substance and Sexual Activity   Alcohol use: Yes    Alcohol/week: 2.0 standard drinks    Types: 2 Glasses of wine per week   Drug use: No   Sexual activity: Not on file  Other Topics Concern   Not on file  Social History Narrative   Not on file   Social Determinants of Health   Financial Resource Strain: Not on file  Food Insecurity: Not on file  Transportation Needs: Not on file  Physical Activity: Not on file  Stress: Not on  file  Social Connections: Not on file   Vitals:   01/20/21 1126  BP: 128/80  Pulse: 62  Resp: 16  SpO2: 99%   Body mass index is 34.06 kg/m.  Physical Exam Vitals and nursing note reviewed.  Constitutional:      General: She is not in acute distress.    Appearance: She is well-developed.  HENT:     Head: Normocephalic and atraumatic.     Mouth/Throat:     Mouth: Mucous membranes are moist.  Eyes:     Conjunctiva/sclera: Conjunctivae normal.  Cardiovascular:     Rate and Rhythm: Normal rate and regular rhythm.     Heart sounds: No  murmur heard. Pulmonary:     Effort: Pulmonary effort is normal. No respiratory distress.     Breath sounds: Normal breath sounds.  Abdominal:     Palpations: Abdomen is soft. There is no hepatomegaly or mass.     Tenderness: There is no abdominal tenderness.  Musculoskeletal:       Back:  Lymphadenopathy:     Cervical: No cervical adenopathy.     Upper Body:     Right upper body: No supraclavicular adenopathy.     Left upper body: No supraclavicular adenopathy.  Skin:    General: Skin is warm.     Findings: No erythema or rash.  Neurological:     General: No focal deficit present.     Mental Status: She is alert and oriented to person, place, and time.     Cranial Nerves: No cranial nerve deficit.     Gait: Gait normal.  Psychiatric:     Comments: Well groomed, good eye contact.   ASSESSMENT AND PLAN:  Ms.[name] was seen today for follow-up.  Diagnoses and all orders for this visit:  Costal margin pain -     DG Chest 2 View; Future  Right flank pain We discussed possible causes, hx suggest musculoskeletal pain. She has abdominal CT scheduled. Monitor for new symptoms. Instructed about warning signs.  Dyspepsia Recommend completing 6-8 weeks of Prevacid. GERD precautions and avoid NSAID's.  -     lansoprazole (PREVACID) 30 MG capsule; Take 1 capsule (30 mg total) by mouth daily at 12 noon.  Return if symptoms worsen or fail to improve.  Laura Radilla G. Martin[place], MD  [facility]. [place] office.

## 2021-01-22 ENCOUNTER — Other Ambulatory Visit: Payer: Self-pay

## 2021-01-22 ENCOUNTER — Ambulatory Visit (INDEPENDENT_AMBULATORY_CARE_PROVIDER_SITE_OTHER)
Admission: RE | Admit: 2021-01-22 | Discharge: 2021-01-22 | Disposition: A | Discharge status: Home / Self Care | Payer: 59 | Source: Ambulatory Visit | Attending: Family Medicine | Admitting: Family Medicine

## 2021-01-22 DIAGNOSIS — R1011 Right upper quadrant pain: Secondary | ICD-10-CM

## 2021-01-28 ENCOUNTER — Encounter: Payer: Self-pay | Admitting: Physician Assistant

## 2021-01-28 ENCOUNTER — Ambulatory Visit: Payer: 59 | Admitting: Physician Assistant

## 2021-01-28 VITALS — BP 124/82 | HR 77 | Ht 68.0 in | Wt 227.0 lb

## 2021-01-28 DIAGNOSIS — Z800 Family history of malignant neoplasm of digestive organs: Secondary | ICD-10-CM | POA: Diagnosis not present

## 2021-01-28 DIAGNOSIS — R140 Abdominal distension (gaseous): Secondary | ICD-10-CM

## 2021-01-28 DIAGNOSIS — R1011 Right upper quadrant pain: Secondary | ICD-10-CM

## 2021-01-28 DIAGNOSIS — K219 Gastro-esophageal reflux disease without esophagitis: Secondary | ICD-10-CM

## 2021-01-28 NOTE — Progress Notes (Signed)
Chief Complaint: Right-sided abdominal pain with bloating and gas, family history of gastric cancer  HPI:    Carmen Knight is a 55 year old female with a past medical history as listed below including reflux, known to Dr. Christella Hartigan, who was referred to me by [place], Betty G, MD for a complaint of right-sided abdominal pain and bloating and gas and a family history of gastric cancer.    02/18/2016 screening colonoscopy was normal.  Repeat recommended 10 years.    12/30/2020 urine culture negative, urinalysis negative.  CMP and CBC as well as lipase unremarkable.    12/31/2020 right upper quadrant ultrasound for right-sided abdominal pain with no acute findings.  Specifically no gallstones or findings to suggest an acute cholecystitis.  Increased hepatic echogenicity concerning for hepatic steatosis.    01/20/2021 chest x-ray with no explanation for patient's right posterior rib pain.    01/20/2021 patient saw PCP in regards to right flank pain.  As discussed this is likely musculoskeletal pain.  Discussed all the negative imaging.  She was told to complete 6 to 8 weeks of Prevacid 30 mg once daily.    01/22/2021 CT renal stone study showed no acute abnormality in the abdomen or pelvis.  Negative for kidney stones or hydronephrosis.  Tiny umbilical hernia containing fat.    Today, the patient describes that for the past couple of months she has been experiencing a "discomfort" on the right side of her abdomen, this seems to move around and is sometimes just under her rib cage and other times she feels more to the back.  This is almost always there, but sometimes she notices it more than others.  Tells me it is not enough of a pain that it inhibits her from any of her activities or her job but just enough that she is starting to worry about it.  Describes a family history of gastric cancer in her mother diagnosed in her 108s and apparently her mother always had some various GI complaints that she treated  over-the-counter etc. and then eventually ended up with cancer.  Patient does not want to dismiss any of her symptoms.  Does describe some reflux better with Prevacid 30 mg over the past 6 weeks and some bloating and gas.  Does tell me that she limits her diet and does not eat a lot of gluten or dairy as she knows these things bother her.  Just wants to figure out what is going on.  Has tried Advil for this pain and it helped a couple of times.  Was also put on antibiotics at one point given some blood in her urine and took these for 2 days and felt better then as well, but her PCP stopped these after there was no finding of urinary infection and culture was negative.  Also describes that at one point she ate something at her desk and a little while later felt a very strange "pop" sensation in that region.    Denies fever, chills, weight loss, symptoms that awaken her from sleep, blood in her stool or change in bowel habits.  Past Medical History:  Diagnosis Date   Sleep apnea     Past Surgical History:  Procedure Laterality Date   ABDOMINAL HYSTERECTOMY     FOOT SURGERY      Current Outpatient Medications  Medication Sig Dispense Refill   albuterol (PROVENTIL HFA;VENTOLIN HFA) 108 (90 Base) MCG/ACT inhaler Take 2 puffs by mouth every 6 hours as needed 8.5 Inhaler 0  fexofenadine (ALLEGRA) 180 MG tablet Take 180 mg by mouth as needed for allergies or rhinitis. Reported on 07/02/2015     lansoprazole (PREVACID) 30 MG capsule Take 1 capsule (30 mg total) by mouth daily at 12 noon. 30 capsule 0   No current facility-administered medications for this visit.    Allergies as of 01/28/2021   (No Known Allergies)    Family History  Problem Relation Age of Onset   Prostate cancer Father    Stomach cancer Mother        passed away with stomach cancer     Social History   Socioeconomic History   Marital status: Married    Spouse name: Not on file   Number of children: Not on file   Years  of education: Not on file   Highest education level: Not on file  Occupational History   Not on file  Tobacco Use   Smoking status: Never   Smokeless tobacco: Never  Substance and Sexual Activity   Alcohol use: Yes    Alcohol/week: 2.0 standard drinks    Types: 2 Glasses of wine per week   Drug use: No   Sexual activity: Not on file  Other Topics Concern   Not on file  Social History Narrative   Not on file   Social Determinants of Health   Financial Resource Strain: Not on file  Food Insecurity: Not on file  Transportation Needs: Not on file  Physical Activity: Not on file  Stress: Not on file  Social Connections: Not on file  Intimate Partner Violence: Not on file    Review of Systems:    Constitutional: No weight loss, fever or chills Skin: No rash  Cardiovascular: No chest pain  Respiratory: No SOB  Gastrointestinal: See HPI and otherwise negative Genitourinary: No dysuria  Neurological: No headache, dizziness or syncope Musculoskeletal: No new muscle or joint pain Hematologic: No bleeding  Psychiatric: No history of depression or anxiety   Physical Exam:  Vital signs: BP 124/82   Pulse 77   Ht 5\' 8"  (1.727 m)   Wt 227 lb (103 kg)   BMI 34.52 kg/m    Constitutional:   Pleasant overweight Caucasian female appears to be in NAD, Well developed, Well nourished, alert and cooperative Head:  Normocephalic and atraumatic. Eyes:   PEERL, EOMI. No icterus. Conjunctiva pink. Ears:  Normal auditory acuity. Neck:  Supple Throat: Oral cavity and pharynx without inflammation, swelling or lesion.  Respiratory: Respirations even and unlabored. Lungs clear to auscultation bilaterally.   No wheezes, crackles, or rhonchi.  Cardiovascular: Normal S1, S2. No MRG. Regular rate and rhythm. No peripheral edema, cyanosis or pallor.  Gastrointestinal:  Soft, nondistended, mild right sided ttp over rib cage/under, No rebound or guarding. Normal bowel sounds. No appreciable masses  or hepatomegaly. Rectal:  Not performed.  Msk:  Symmetrical without gross deformities. Without edema, no deformity or joint abnormality.  Neurologic:  Alert and  oriented x4;  grossly normal neurologically.  Skin:   Dry and intact without significant lesions or rashes. Psychiatric: Demonstrates good judgement and reason without abnormal affect or behaviors.  RELEVANT LABS AND IMAGING: CBC    Component Value Date/Time   WBC 5.2 12/30/2020 1757   RBC 4.20 12/30/2020 1757   HGB 12.9 12/30/2020 1757   HCT 38.7 12/30/2020 1757   PLT 277 12/30/2020 1757   MCV 92.1 12/30/2020 1757   MCH 30.7 12/30/2020 1757   MCHC 33.3 12/30/2020 1757   RDW 13.0  12/30/2020 1757   LYMPHSABS 1,721 12/30/2020 1757   MONOABS 0.4 04/02/2013 0852   EOSABS 42 12/30/2020 1757   BASOSABS 10 12/30/2020 1757    CMP     Component Value Date/Time   NA 139 12/30/2020 1757   K 4.1 12/30/2020 1757   CL 103 12/30/2020 1757   CO2 25 12/30/2020 1757   GLUCOSE 105 (H) 12/30/2020 1757   BUN 15 12/30/2020 1757   CREATININE 0.71 12/30/2020 1757   CALCIUM 9.5 12/30/2020 1757   PROT 6.9 12/30/2020 1757   ALBUMIN 4.4 11/17/2018 0902   AST 17 12/30/2020 1757   ALT 15 12/30/2020 1757   ALKPHOS 90 11/17/2018 0902   BILITOT 0.3 12/30/2020 1757   GFRNONAA 96 01/14/2020 0853   GFRAA 111 01/14/2020 0853    Assessment: 1.  GERD: Chronic symptoms, some better with Prevacid 30 mg daily; likely gastritis +/- H. pylori 2.  Right upper quadrant pain: Chronic over the past 2 months, previous CT renal stone study, x-ray and ultrasound all negative/normal; consider musculoskeletal versus biliary dyskinesia versus duodenitis/PUD 3.  Family history of gastric cancer: In her mother diagnosed in her 76s 4.  Gas/bloating: Consider relation to reflux/dyspepsia +/- SIBO  Plan: 1.  Discussed with patient that given her family history of gastric cancer and her own history of reflux/dyspepsia would recommend we proceed with EGD for  further evaluation.  This was scheduled with Dr. Adela Lank in the [facility] given that Dr. Christella Hartigan did not have any appointments until the end of December.  Did provide the patient a detailed list of risks for the procedure. Patient is appropriate for endoscopic procedure(s) in the ambulatory ([facility]) setting.  Patient will continue to follow with Dr. Christella Hartigan as her primary GI physician after time of procedure. 2.  Discussed this right upper quadrant pain, question whether this is musculoskeletal or possibly gallbladder dysfunction or possibly duodenitis/gastritis.  Would also recommend she have a HIDA scan with CCK given that sometimes it is felt after eating.  This is ordered today. 3.  Continue Pepcid 30 mg daily 4.  In the future if all the above is negative/normal could consider a trial of antibiotics for SIBO as patient previously felt some improvement in symptoms while on antibiotics. 5.  Patient to follow in clinic per recommendations after procedure and imaging above.  Hyacinth Meeker, PA-C [facility] 01/28/2021, 9:01 AM  Cc: [place], Betty G, MD

## 2021-01-28 NOTE — Patient Instructions (Signed)
Continue Prevacid daily.  You have been scheduled for a HIDA scan at [facility] (1st floor) on Monday 02/02/21. Please arrive 15 minutes prior to your scheduled appointment at  11 am. Make certain not to have anything to eat or drink at least 6 hours prior to your test. Should this appointment date or time not work well for you, please call radiology scheduling at [phone].  ___________________________________________________________________ hepatobiliary (HIDA) scan is an imaging procedure used to diagnose problems in the liver, gallbladder and bile ducts. In the HIDA scan, a radioactive chemical or tracer is injected into a vein in your arm. The tracer is handled by the liver like bile. Bile is a fluid produced and excreted by your liver that helps your digestive system break down fats in the foods you eat. Bile is stored in your gallbladder and the gallbladder releases the bile when you eat a meal. A special nuclear medicine scanner (gamma camera) tracks the flow of the tracer from your liver into your gallbladder and small intestine.  During your HIDA scan  You'll be asked to change into a Knight gown before your HIDA scan begins. Your health care team will position you on a table, usually on your back. The radioactive tracer is then injected into a vein in your arm.The tracer travels through your bloodstream to your liver, where it's taken up by the bile-producing cells. The radioactive tracer travels with the bile from your liver into your gallbladder and through your bile ducts to your small intestine.You may feel some pressure while the radioactive tracer is injected into your vein. As you lie on the table, a special gamma camera is positioned over your abdomen taking pictures of the tracer as it moves through your body. The gamma camera takes pictures continually for about an hour. You'll need to keep still during the HIDA scan. This can become uncomfortable, but you may find that you  can lessen the discomfort by taking deep breaths and thinking about other things. Tell your health care team if you're uncomfortable. The radiologist will watch on a computer the progress of the radioactive tracer through your body. The HIDA scan may be stopped when the radioactive tracer is seen in the gallbladder and enters your small intestine. This typically takes about an hour. In some cases extra imaging will be performed if original images aren't satisfactory, if morphine is given to help visualize the gallbladder or if the medication CCK is given to look at the contraction of the gallbladder. This test typically takes 2 hours to complete. ____________________________________________________________________  Carmen Knight have been scheduled for an endoscopy. Please follow written instructions given to you at your visit today. If you use inhalers (even only as needed), please bring them with you on the day of your procedure.  If you are age 56 or older, your body mass index should be between 23-30. Your Body mass index is 34.52 kg/m. If this is out of the aforementioned range listed, please consider follow up with your Primary Care Provider.  If you are age 36 or younger, your body mass index should be between 19-25. Your Body mass index is 34.52 kg/m. If this is out of the aformentioned range listed, please consider follow up with your Primary Care Provider.   ________________________________________________________  The [facility] providers would like to encourage you to use [facility] to communicate with providers for non-urgent requests or questions.  Due to long hold times on the telephone, sending your provider a message by Carmen [facility]  may be a faster and more efficient way to get a response.  Please allow 48 business hours for a response.  Please remember that this is for non-urgent requests.  _______________________________________________________

## 2021-01-28 NOTE — Progress Notes (Signed)
Got it, happy to help. Will let you know the results.

## 2021-01-28 NOTE — Progress Notes (Signed)
I agree with the above note, plan.  Carmen Knight once again thank you for helping her since I am not able to do her procedure sooner.

## 2021-02-02 ENCOUNTER — Other Ambulatory Visit: Payer: Self-pay

## 2021-02-02 ENCOUNTER — Encounter (HOSPITAL_COMMUNITY)
Admission: RE | Admit: 2021-02-02 | Discharge: 2021-02-02 | Disposition: A | Discharge status: Home / Self Care | Payer: 59 | Source: Ambulatory Visit | Attending: Physician Assistant | Admitting: Physician Assistant

## 2021-02-02 ENCOUNTER — Encounter: Payer: Self-pay | Admitting: Gastroenterology

## 2021-02-02 DIAGNOSIS — R1011 Right upper quadrant pain: Secondary | ICD-10-CM | POA: Insufficient documentation

## 2021-02-02 DIAGNOSIS — K219 Gastro-esophageal reflux disease without esophagitis: Secondary | ICD-10-CM | POA: Diagnosis present

## 2021-02-02 MED ORDER — TECHNETIUM TC 99M MEBROFENIN IV KIT
5.4000 | PACK | Freq: Once | INTRAVENOUS | Status: AC | PRN
  Administered 2021-02-02: 5.4 via INTRAVENOUS

## 2021-02-09 ENCOUNTER — Ambulatory Visit (AMBULATORY_SURGERY_CENTER): Payer: 59 | Admitting: Gastroenterology

## 2021-02-09 ENCOUNTER — Other Ambulatory Visit: Payer: Self-pay | Admitting: Gastroenterology

## 2021-02-09 ENCOUNTER — Encounter: Payer: Self-pay | Admitting: Gastroenterology

## 2021-02-09 VITALS — BP 107/65 | HR 58 | Temp 97.1°F | Resp 15 | Ht 68.0 in | Wt 227.0 lb

## 2021-02-09 DIAGNOSIS — R140 Abdominal distension (gaseous): Secondary | ICD-10-CM | POA: Diagnosis not present

## 2021-02-09 DIAGNOSIS — K2970 Gastritis, unspecified, without bleeding: Secondary | ICD-10-CM | POA: Diagnosis not present

## 2021-02-09 DIAGNOSIS — K449 Diaphragmatic hernia without obstruction or gangrene: Secondary | ICD-10-CM

## 2021-02-09 DIAGNOSIS — K219 Gastro-esophageal reflux disease without esophagitis: Secondary | ICD-10-CM | POA: Diagnosis not present

## 2021-02-09 DIAGNOSIS — K2950 Unspecified chronic gastritis without bleeding: Secondary | ICD-10-CM

## 2021-02-09 DIAGNOSIS — R1011 Right upper quadrant pain: Secondary | ICD-10-CM

## 2021-02-09 MED ORDER — SODIUM CHLORIDE 0.9 % IV SOLN: 500.0000 mL | Freq: Once | INTRAVENOUS | Status: DC

## 2021-02-09 NOTE — Progress Notes (Signed)
History and Physical Interval Note: See clinic note for 01/28/21 for full details of her case. History of GERD, ongoing intermittent RUQ pain for a few months. CT scan, RUQ [place], and HIDA all negative. Labs look okay. Mother had gastric cancer dx age 56. No prior EGD. EGD to further evaluate. No interval changes since her last visit. She wishes to proceed.  02/09/2021 9:26 AM  Carmen Knight  has presented today for endoscopic procedure(s), with the diagnosis of  Encounter Diagnoses  Name Primary?   RUQ pain Yes   Gastroesophageal reflux disease, unspecified whether esophagitis present   .  The various methods of evaluation and treatment have been discussed with the patient and/or family. After consideration of risks, benefits and other options for treatment, the patient has consented to  the endoscopic procedure(s).   The patient's history has been reviewed, patient examined, no change in status, stable for surgery.  I have reviewed the patient's chart and labs.  Questions were answered to the patient's satisfaction.    Harlin Rain, MD [facility]

## 2021-02-09 NOTE — Progress Notes (Signed)
Vss nad transferred to pacu 

## 2021-02-09 NOTE — Progress Notes (Signed)
VS by CW. ?

## 2021-02-09 NOTE — Progress Notes (Signed)
Called to room to assist during endoscopic procedure.  Patient ID and intended procedure confirmed with present staff. Received instructions for my participation in the procedure from the performing physician.  

## 2021-02-09 NOTE — Patient Instructions (Signed)
Handout on hiatal hernia given to you today   YOU HAD AN ENDOSCOPIC PROCEDURE TODAY AT THE [facility]:   Refer to the procedure report that was given to you for any specific questions about what was found during the examination.  If the procedure report does not answer your questions, please call your gastroenterologist to clarify.  If you requested that your care partner not be given the details of your procedure findings, then the procedure report has been included in a sealed envelope for you to review at your convenience later.  YOU SHOULD EXPECT: Some feelings of bloating in the abdomen. Passage of more gas than usual.  Walking can help get rid of the air that was put into your GI tract during the procedure and reduce the bloating. If you had a lower endoscopy (such as a colonoscopy or flexible sigmoidoscopy) you may notice spotting of blood in your stool or on the toilet paper. If you underwent a bowel prep for your procedure, you may not have a normal bowel movement for a few days.  Please Note:  You might notice some irritation and congestion in your nose or some drainage.  This is from the oxygen used during your procedure.  There is no need for concern and it should clear up in a day or so.  SYMPTOMS TO REPORT IMMEDIATELY:   Following upper endoscopy (EGD)  Vomiting of blood or coffee ground material  New chest pain or pain under the shoulder blades  Painful or persistently difficult swallowing  New shortness of breath  Fever of 100F or higher  Black, tarry-looking stools  For urgent or emergent issues, a gastroenterologist can be reached at any hour by calling [phone]. Do not use MyChart messaging for urgent concerns.    DIET:  We do recommend a small meal at first, but then you may proceed to your regular diet.  Drink plenty of fluids but you should avoid alcoholic beverages for 24 hours.  ACTIVITY:  You should plan to take it easy for the rest of today and  you should NOT DRIVE or use heavy machinery until tomorrow (because of the sedation medicines used during the test).    FOLLOW UP: Our staff will call the number listed on your records 48-72 hours following your procedure to check on you and address any questions or concerns that you may have regarding the information given to you following your procedure. If we do not reach you, we will leave a message.  We will attempt to reach you two times.  During this call, we will ask if you have developed any symptoms of COVID 19. If you develop any symptoms (ie: fever, flu-like symptoms, shortness of breath, cough etc.) before then, please call [phone].  If you test positive for Covid 19 in the 2 weeks post procedure, please call and report this information to us.    If any biopsies were taken you will be contacted by phone or by letter within the next 1-3 weeks.  Please call us at [phone] if you have not heard about the biopsies in 3 weeks.    SIGNATURES/CONFIDENTIALITY: You and/or your care partner have signed paperwork which will be entered into your electronic medical record.  These signatures attest to the fact that that the information above on your After Visit Summary has been reviewed and is understood.  Full responsibility of the confidentiality of this discharge information lies with you and/or your care-partner.  

## 2021-02-09 NOTE — Op Note (Signed)
[facility] Patient Name: Carmen Knight Procedure Date: 02/09/2021 9:21 AM MRN: [id number] Endoscopist: Viviann Spare P. Brihana Quickel , MD Age: 55 Referring MD:  Date of Birth: Nov 06, 1965 Gender: Female Account #: [id number] Procedure:                Upper GI endoscopy Indications:              Abdominal pain in the right upper quadrant,                            Follow-up of gastro-esophageal reflux disease                            controlled on Prevacid, Abdominal bloating -                            negative CT scan, RUQ [place], HIDA scan - intermittent                            pain in RUQ persists. Mother with history of                            gastric cancer. Dr. Christella Hartigan primary GI MD Medicines:                Monitored Anesthesia Care Procedure:                Pre-Anesthesia Assessment:                           - Prior to the procedure, a History and Physical                            was performed, and patient medications and                            allergies were reviewed. The patient's tolerance of                            previous anesthesia was also reviewed. The risks                            and benefits of the procedure and the sedation                            options and risks were discussed with the patient.                            All questions were answered, and informed consent                            was obtained. Prior Anticoagulants: The patient has                            taken no previous anticoagulant or antiplatelet  agents. ASA Grade Assessment: III - A patient with                            severe systemic disease. After reviewing the risks                            and benefits, the patient was deemed in                            satisfactory condition to undergo the procedure.                           After obtaining informed consent, the endoscope was                            passed under direct  vision. Throughout the                            procedure, the patient's blood pressure, pulse, and                            oxygen saturations were monitored continuously. The                            Endoscope was introduced through the mouth, and                            advanced to the second part of duodenum. The upper                            GI endoscopy was accomplished without difficulty.                            The patient tolerated the procedure well. Scope In: Scope Out: Findings:                 Esophagogastric landmarks were identified: the                            Z-line was found at 38 cm, the gastroesophageal                            junction was found at 38 cm and the upper extent of                            the gastric folds was found at 39 cm from the                            incisors.                           A 1 cm hiatal hernia was present.  There was a small gastric inlet patch in the                            proximal esophagus, normal variant. The exam of the                            esophagus was otherwise normal.                           The entire examined stomach was normal. Biopsies                            were taken with a cold forceps for Helicobacter                            pylori testing.                           The duodenal bulb and second portion of the                            duodenum were normal. Biopsies for histology were                            taken with a cold forceps for evaluation of celiac                            disease. Complications:            No immediate complications. Estimated blood loss:                            Minimal. Estimated Blood Loss:     Estimated blood loss was minimal. Impression:               - Esophagogastric landmarks identified.                           - 1 cm hiatal hernia.                           - Benign gastric inlet patch of proximal  esophagus.                           - Normal stomach. Biopsied to rule out H pylori.                           - Normal duodenal bulb and second portion of the                            duodenum. Biopsied to rule out celiac disease.                           No overt pathology on this exam to cause abdominal  pain. Will await biopsy results. Possible this may                            be musculoskeletal given negative workup to date                            otherwise, will discuss with the patient. Recommendation:           - Patient has a contact number available for                            emergencies. The signs and symptoms of potential                            delayed complications were discussed with the                            patient. Return to normal activities tomorrow.                            Written discharge instructions were provided to the                            patient.                           - Resume previous diet.                           - Continue present medications.                           - Await pathology results with further                            recommendations. Viviann Spare P. Cayle Thunder, MD 02/09/2021 9:41:51 AM This report has been signed electronically.

## 2021-02-11 ENCOUNTER — Telehealth: Payer: Self-pay

## 2021-02-11 NOTE — Telephone Encounter (Signed)
Left message on follow up call. 

## 2021-03-04 ENCOUNTER — Other Ambulatory Visit: Payer: Self-pay | Admitting: Family Medicine

## 2021-03-04 DIAGNOSIS — R1013 Epigastric pain: Secondary | ICD-10-CM

## 2021-04-10 ENCOUNTER — Ambulatory Visit: Payer: 59 | Admitting: Gastroenterology

## 2022-02-24 NOTE — Progress Notes (Unsigned)
Chief Complaint  Patient presents with   Follow-up    Changes in family history, brother recently dx with stage 4 pancreatic cancer.    HPI:  [name] is a 56 y.o. female, who is here today to discuss some concerns about new medical problems in her family as described above. She was last seen on 01/20/21. Her brother was recently Dx'ed with pancreatic cancer and she already has Fhx of stomach cancer, so she is concerned about her risk of developing cancer and inquiring about prevention and possible dx test.  She has hx of RUQ/upper abdominal pain, she has followed with gastroenterologist and reports having had a series of tests last year, including a HIDA scan and endoscopy, otherwise negative. States that her gastroenterologist suggested it might be muscular, so she started seeing a chiropractor. Pain has not resolved, no new associated symptoms.  She experiences intermittent episodes for the past month or so , where she feels the need to have a bowel movement but usually she does not have bathroom availability, she able to wait until she does; she develops nausea and eventually vomiting while having a bowel movement.Stool is looser than usual during these episodes. She has had similar symptoms intermittently for a while. She has not identified exacerbating or alleviating factors but mentions that she has been under a lot of stress at work, worse lately. States that she was checked for celiac disease last year and tests were negative. . She is unsure if she mentioned these episodes to the gastroenterologist during her visit.  She had a colonoscopy in 01/2016, it was recommended a follow-up in 10 years. States that she was not having GI symptoms at that time.  She is also concerned about not being able to lose wt. She has tried different diets and lost wt but she cannot maintain it. Today she ate small croissant with an egg and ham. Dinner last night, was small portion of chili over rice  and for lunch yesterday frozen dumplings and salad. Snacked on small donuts. She is not exercising regularly.  HLD on non pharmacologic treatment. Lab Results  Component Value Date   CHOL 204 (H) 01/14/2020   HDL 52 01/14/2020   LDLCALC 124 (H) 01/14/2020   TRIG 161 (H) 01/14/2020   CHOLHDL 3.9 01/14/2020   Review of Systems  Constitutional:  Negative for activity change, appetite change and fever.  HENT:  Negative for mouth sores, nosebleeds, sore throat and trouble swallowing.   Respiratory:  Negative for cough, shortness of breath and wheezing.   Cardiovascular:  Negative for chest pain, palpitations and leg swelling.  Endocrine: Negative for cold intolerance and heat intolerance.  Genitourinary:  Negative for decreased urine volume, dysuria and hematuria.  Neurological:  Negative for syncope, weakness and headaches.  Psychiatric/Behavioral:  The patient is nervous/anxious.   See other pertinent positives and negatives in HPI.  Current Outpatient Medications on File Prior to Visit  Medication Sig Dispense Refill   albuterol (PROVENTIL HFA;VENTOLIN HFA) 108 (90 Base) MCG/ACT inhaler Take 2 puffs by mouth every 6 hours as needed 8.5 Inhaler 0   fexofenadine (ALLEGRA) 180 MG tablet Take 180 mg by mouth as needed for allergies or rhinitis. Reported on 07/02/2015     lansoprazole (PREVACID) 30 MG capsule TAKE 1 CAPSULE BY MOUTH DAILY AT 12 NOON 30 capsule 0   No current facility-administered medications on file prior to visit.   Past Medical History:  Diagnosis Date   GERD (gastroesophageal reflux disease)  Sleep apnea    No Known Allergies  Social History   Socioeconomic History   Marital status: Married    Spouse name: Not on file   Number of children: Not on file   Years of education: Not on file   Highest education level: Not on file  Occupational History   Not on file  Tobacco Use   Smoking status: Never   Smokeless tobacco: Never  Vaping Use   Vaping Use:  Never used  Substance and Sexual Activity   Alcohol use: Yes    Alcohol/week: 2.0 standard drinks of alcohol    Types: 2 Glasses of wine per week   Drug use: No   Sexual activity: Not on file  Other Topics Concern   Not on file  Social History Narrative   Not on file   Social Determinants of Health   Financial Resource Strain: Not on file  Food Insecurity: Not on file  Transportation Needs: Not on file  Physical Activity: Not on file  Stress: Not on file  Social Connections: Not on file   Vitals:   02/26/22 1124  BP: 128/70  Pulse: 86  Resp: 16  SpO2: 98%   Wt Readings from Last 3 Encounters:  02/26/22 230 lb 6 oz (104.5 kg)  02/09/21 227 lb (103 kg)  01/28/21 227 lb (103 kg)   Body mass index is 35.03 kg/m.  Physical Exam Vitals and nursing note reviewed.  Constitutional:      General: She is not in acute distress.    Appearance: She is well-developed.  HENT:     Head: Normocephalic and atraumatic.     Mouth/Throat:     Mouth: Mucous membranes are moist.     Pharynx: Oropharynx is clear.  Eyes:     Conjunctiva/sclera: Conjunctivae normal.  Cardiovascular:     Rate and Rhythm: Normal rate and regular rhythm.     Heart sounds: No murmur heard. Pulmonary:     Effort: Pulmonary effort is normal. No respiratory distress.     Breath sounds: Normal breath sounds.  Abdominal:     Palpations: Abdomen is soft. There is no hepatomegaly or mass.     Tenderness: There is no abdominal tenderness.  Lymphadenopathy:     Cervical: No cervical adenopathy.  Skin:    General: Skin is warm.     Findings: No erythema or rash.  Neurological:     General: No focal deficit present.     Mental Status: She is alert and oriented to person, place, and time.     Cranial Nerves: No cranial nerve deficit.     Gait: Gait normal.  Psychiatric:        Mood and Affect: Mood is anxious.     Comments: Well groomed, good eye contact.   ASSESSMENT AND PLAN:  [name] was seen today  for follow-up.  Diagnoses and all orders for this visit: Orders Placed This Encounter  Procedures   Comprehensive metabolic panel   Lipase   Lipid panel   Hemoglobin A1c   Amb Ref to Medical Weight Management    Obesity, Class I, BMI 30-34.9 We discussed benefits of wt loss as well as adverse effects of obesity.He wt has been otherwise stable, she feels like she needs help, so referral to health wt management placed. Consistency with healthy diet and physical activity encouraged.   Right upper quadrant abdominal pain Chronic. She has seen GI and has had work up done, otherwise negative. She is concerned about  the possibility of having a serious process that has not been identified. Pancreas unremarkable on abdominal CT done in 01/2021, negative for pelvic/abdominal abnormalities. Recommend arranging a follow up appt with her GI. Will come back next week for blood work.  Nausea and vomiting in adult Reporting intermittent episodes of urgency,loose stool, nausea, and vomiting. We discussed possible etiologies,?  IBS. Recommend arranging appointment with her gastroenterologist, she may need a colonoscopy sooner than planned it.  Hyperlipidemia Non pharmacologic treatment recommended for now. Further recommendations will be given according to 10 years CVD risk score and lipid panel numbers. She is coming back next week for fasting labs.  We discussed current recommendations in regard to cancer screening. She is due for mammogram. Explained that there is no screening some forms of malignancies, I do not think genetic screening is necessary at this time. Consistently with following a healthful diet and exercising regularly encouraged for prevention of chronic illness.  Return in about 3 days (around 03/01/2022) for Labs.  Averi Kilty G. [place], MD  [facility]. [place] office.

## 2022-02-26 ENCOUNTER — Ambulatory Visit: Payer: 59 | Admitting: Family Medicine

## 2022-02-26 ENCOUNTER — Encounter: Payer: Self-pay | Admitting: Family Medicine

## 2022-02-26 VITALS — BP 128/70 | HR 86 | Resp 16 | Ht 68.0 in | Wt 230.4 lb

## 2022-02-26 DIAGNOSIS — Z131 Encounter for screening for diabetes mellitus: Secondary | ICD-10-CM

## 2022-02-26 DIAGNOSIS — R1011 Right upper quadrant pain: Secondary | ICD-10-CM

## 2022-02-26 DIAGNOSIS — R112 Nausea with vomiting, unspecified: Secondary | ICD-10-CM | POA: Insufficient documentation

## 2022-02-26 DIAGNOSIS — E785 Hyperlipidemia, unspecified: Secondary | ICD-10-CM

## 2022-02-26 DIAGNOSIS — E669 Obesity, unspecified: Secondary | ICD-10-CM | POA: Diagnosis not present

## 2022-02-26 NOTE — Patient Instructions (Addendum)
A few things to remember from today's visit:  Hyperlipidemia, unspecified hyperlipidemia type  Nausea and vomiting in adult  Right upper quadrant abdominal pain  Obesity, Class II, BMI 35-39.9 - Plan: Amb Ref to Medical Weight Management  Arrange appt with your gastroenterologist.  If you need refills for medications you take chronically, please call your pharmacy. Do not use My Chart to request refills or for acute issues that need immediate attention. If you send a my chart message, it may take a few days to be addressed, specially if I am not in the office.  Please be sure medication list is accurate. If a new problem present, please set up appointment sooner than planned today.

## 2022-02-27 ENCOUNTER — Encounter: Payer: Self-pay | Admitting: Family Medicine

## 2022-02-27 NOTE — Assessment & Plan Note (Signed)
We discussed benefits of wt loss as well as adverse effects of obesity.He wt has been otherwise stable, she feels like she needs help, so referral to health wt management placed. Consistency with healthy diet and physical activity encouraged.

## 2022-02-27 NOTE — Assessment & Plan Note (Signed)
Chronic. She has seen GI and has had work up done, otherwise negative. She is concerned about the possibility of having a serious process that has not been identified. Pancreas unremarkable on abdominal CT done in 01/2021, negative for pelvic/abdominal abnormalities. Recommend arranging a follow up appt with her GI. Will come back next week for blood work.

## 2022-02-27 NOTE — Assessment & Plan Note (Signed)
Non pharmacologic treatment recommended for now. Further recommendations will be given according to 10 years CVD risk score and lipid panel numbers. She is coming back next week for fasting labs.

## 2022-02-27 NOTE — Assessment & Plan Note (Signed)
Reporting intermittent episodes of urgency,loose stool, nausea, and vomiting. We discussed possible etiologies,?  IBS. Recommend arranging appointment with her gastroenterologist, she may need a colonoscopy sooner than planned it.

## 2022-03-02 ENCOUNTER — Other Ambulatory Visit (INDEPENDENT_AMBULATORY_CARE_PROVIDER_SITE_OTHER): Payer: 59

## 2022-03-02 DIAGNOSIS — R112 Nausea with vomiting, unspecified: Secondary | ICD-10-CM

## 2022-03-02 DIAGNOSIS — E669 Obesity, unspecified: Secondary | ICD-10-CM

## 2022-03-02 DIAGNOSIS — E785 Hyperlipidemia, unspecified: Secondary | ICD-10-CM | POA: Diagnosis not present

## 2022-03-02 DIAGNOSIS — R1011 Right upper quadrant pain: Secondary | ICD-10-CM | POA: Diagnosis not present

## 2022-03-02 DIAGNOSIS — Z131 Encounter for screening for diabetes mellitus: Secondary | ICD-10-CM

## 2022-03-02 LAB — LIPID PANEL
Cholesterol: 179 mg/dL (ref 0–200)
HDL: 44.2 mg/dL (ref >39.00)
NonHDL: 134.34
Total CHOL/HDL Ratio: 4
Triglycerides: 206 mg/dL — ABNORMAL HIGH (ref 0.0–149.0)
VLDL: 41.2 mg/dL — ABNORMAL HIGH (ref 0.0–40.0)

## 2022-03-02 LAB — COMPREHENSIVE METABOLIC PANEL
ALT: 13 U/L (ref 0–35)
AST: 16 U/L (ref 0–37)
Albumin: 4.1 g/dL (ref 3.5–5.2)
Alkaline Phosphatase: 72 U/L (ref 39–117)
BUN: 11 mg/dL (ref 6–23)
CO2: 28 mEq/L (ref 19–32)
Calcium: 8.8 mg/dL (ref 8.4–10.5)
Chloride: 102 mEq/L (ref 96–112)
Creatinine, Ser: 0.73 mg/dL (ref 0.40–1.20)
GFR: 91.38 mL/min (ref >60.00)
Glucose, Bld: 105 mg/dL — ABNORMAL HIGH (ref 70–99)
Potassium: 4.1 mEq/L (ref 3.5–5.1)
Sodium: 136 mEq/L (ref 135–145)
Total Bilirubin: 0.6 mg/dL (ref 0.2–1.2)
Total Protein: 6.6 g/dL (ref 6.0–8.3)

## 2022-03-02 LAB — LIPASE: Lipase: 20 U/L (ref 11.0–59.0)

## 2022-03-02 LAB — LDL CHOLESTEROL, DIRECT: Direct LDL: 105 mg/dL

## 2022-03-02 LAB — HEMOGLOBIN A1C: Hgb A1c MFr Bld: 5.9 % (ref 4.6–6.5)

## 2022-04-06 ENCOUNTER — Encounter (INDEPENDENT_AMBULATORY_CARE_PROVIDER_SITE_OTHER): Payer: Self-pay

## 2022-08-12 ENCOUNTER — Telehealth: Payer: Self-pay | Admitting: Physician Assistant

## 2022-08-12 NOTE — Telephone Encounter (Signed)
Lm on vm for patient to return call 

## 2022-08-12 NOTE — Telephone Encounter (Signed)
Patient called requesting to speak about recent change in medical history. She is requesting a call back to discuss preventative steps she may need to take. Please advise, thank you.

## 2022-08-17 ENCOUNTER — Encounter: Payer: Self-pay | Admitting: Physician Assistant

## 2022-08-17 NOTE — Telephone Encounter (Signed)
Called and spoke with patient regarding recommendations. Patient has been scheduled for a follow up with Dr. Adela Lank on Tuesday, 11/30/22 at 1:20 pm. Patient verbalized understanding and had no concerns at the end of the call.

## 2022-08-17 NOTE — Telephone Encounter (Signed)
Called and spoke with patient. Patient wanted to update you on her family history. Patient states that her mother was diagnosed with stomach cancer at the age of 77 in 09/21/15 - she is deceased. Patient's brother was diagnosed in 03/2022 with pancreatic cancer - he is 34 - currently in hospice. Patient would like to know if there are any additional screening that she needs to do based on her family history. Most recent EGD was in 01/2021. Please advise, thanks

## 2022-08-17 NOTE — Telephone Encounter (Signed)
Her mother's cancer history at that age likely will not influence any additional testing that she needs in regards to an EGD, she had one recently.  I will need to talk with her further about her brother's history of pancreatic cancer and if she would want screening for that, there is a bit more to talk about with that and likely be best to do at a clinic visit.  Can help book her an office visit with me.  Thanks

## 2022-11-03 ENCOUNTER — Telehealth (INDEPENDENT_AMBULATORY_CARE_PROVIDER_SITE_OTHER): Payer: 59 | Admitting: Family Medicine

## 2022-11-11 ENCOUNTER — Encounter: Payer: Self-pay | Admitting: Family Medicine

## 2022-11-11 LAB — HM MAMMOGRAPHY

## 2022-11-11 LAB — HM PAP SMEAR

## 2022-11-30 ENCOUNTER — Ambulatory Visit: Payer: 59 | Admitting: Gastroenterology

## 2022-12-14 NOTE — Progress Notes (Unsigned)
HPI : last seen by Victorino Dike 01/2021 57 year old female with a past medical history as listed below including reflux, known to Dr. Christella Hartigan, who was referred to me by [place], Betty G, MD for a complaint of right-sided abdominal pain and bloating and gas and a family history of gastric cancer.    02/18/2016 screening colonoscopy was normal.  Repeat recommended 10 years.    12/30/2020 urine culture negative, urinalysis negative.  CMP and CBC as well as lipase unremarkable.    12/31/2020 right upper quadrant ultrasound for right-sided abdominal pain with no acute findings.  Specifically no gallstones or findings to suggest an acute cholecystitis.  Increased hepatic echogenicity concerning for hepatic steatosis.    01/20/2021 chest x-ray with no explanation for patient's right posterior rib pain.    01/20/2021 patient saw PCP in regards to right flank pain.  As discussed this is likely musculoskeletal pain.  Discussed all the negative imaging.  She was told to complete 6 to 8 weeks of Prevacid 30 mg once daily.    01/22/2021 CT renal stone study showed no acute abnormality in the abdomen or pelvis.  Negative for kidney stones or hydronephrosis.  Tiny umbilical hernia containing fat.    Today, the patient describes that for the past couple of months she has been experiencing a "discomfort" on the right side of her abdomen, this seems to move around and is sometimes just under her rib cage and other times she feels more to the back.  This is almost always there, but sometimes she notices it more than others.  Tells me it is not enough of a pain that it inhibits her from any of her activities or her job but just enough that she is starting to worry about it.  Describes a family history of gastric cancer in her mother diagnosed in her 70s and apparently her mother always had some various GI complaints that she treated over-the-counter etc. and then eventually ended up with cancer.  Patient does not want to dismiss any  of her symptoms.  Does describe some reflux better with Prevacid 30 mg over the past 6 weeks and some bloating and gas.  Does tell me that she limits her diet and does not eat a lot of gluten or dairy as she knows these things bother her.  Just wants to figure out what is going on.  Has tried Advil for this pain and it helped a couple of times.  Was also put on antibiotics at one point given some blood in her urine and took these for 2 days and felt better then as well, but her PCP stopped these after there was no finding of urinary infection and culture was negative.  Also describes that at one point she ate something at her desk and a little while later felt a very strange "pop" sensation in that region.    Denies fever, chills, weight loss, symptoms that awaken her from sleep, blood in her stool or change in bowel habits.    Assessment: 1.  GERD: Chronic symptoms, some better with Prevacid 30 mg daily; likely gastritis +/- H. pylori 2.  Right upper quadrant pain: Chronic over the past 2 months, previous CT renal stone study, x-ray and ultrasound all negative/normal; consider musculoskeletal versus biliary dyskinesia versus duodenitis/PUD 3.  Family history of gastric cancer: In her mother diagnosed in her 61s 4.  Gas/bloating: Consider relation to reflux/dyspepsia +/- SIBO   Plan: 1.  Discussed with patient that given her family  history of gastric cancer and her own history of reflux/dyspepsia would recommend we proceed with EGD for further evaluation.  This was scheduled with Dr. Adela Lank in the [facility] given that Dr. Christella Hartigan did not have any appointments until the end of December.  Did provide the patient a detailed list of risks for the procedure. Patient is appropriate for endoscopic procedure(s) in the ambulatory ([facility]) setting.  Patient will continue to follow with Dr. Christella Hartigan as her primary GI physician after time of procedure. 2.  Discussed this right upper quadrant pain, question whether this is  musculoskeletal or possibly gallbladder dysfunction or possibly duodenitis/gastritis.  Would also recommend she have a HIDA scan with [facility] given that sometimes it is felt after eating.  This is ordered today. 3.  Continue Pepcid 30 mg daily 4.  In the future if all the above is negative/normal could consider a trial of antibiotics for SIBO as patient previously felt some improvement in symptoms while on antibiotics. 5.  Patient to follow in clinic per recommendations after procedure and imaging above.     EGD 02/09/21: - Esophagogastric landmarks were identified: the Z-line was found at 38 cm, the gastroesophageal junction was found at 38 cm and the upper extent of the gastric folds was found at 39 cm from the incisors. Findings: - A 1 cm hiatal hernia was present. - There was a small gastric inlet patch in the proximal esophagus, normal variant. The exam of the esophagus was otherwise normal. - The entire examined stomach was normal. Biopsies were taken with a cold forceps for Helicobacter pylori testing. - The duodenal bulb and second portion of the duodenum were normal. Biopsies for histology were taken with a cold forceps for evaluation of celiac disease.  1. Surgical [P], duodenum biopsy - DUODENAL MUCOSA WITH NO SPECIFIC HISTOPATHOLOGIC CHANGES - NEGATIVE FOR INCREASED INTRAEPITHELIAL LYMPHOCYTES OR VILLOUS ARCHITECTURAL CHANGES 2. Surgical [P], gastric antrum and gastric body biopsy - GASTRIC ANTRAL AND OXYNTIC MUCOSA WITH MILD CHRONIC GASTRITIS - WARTHIN STARRY STAIN IS NEGATIVE FOR HELICOBACTER PYLORI       Past Medical History:  Diagnosis Date   GERD (gastroesophageal reflux disease)    Sleep apnea      Past Surgical History:  Procedure Laterality Date   ABDOMINAL HYSTERECTOMY     FOOT SURGERY     Family History  Problem Relation Age of Onset   Stomach cancer Mother        passed away with stomach cancer    Prostate cancer Father    Pancreatic cancer Brother    Colon  cancer Neg Hx    Esophageal cancer Neg Hx    Rectal cancer Neg Hx    Social History   Tobacco Use   Smoking status: Never   Smokeless tobacco: Never  Vaping Use   Vaping status: Never Used  Substance Use Topics   Alcohol use: Yes    Alcohol/week: 2.0 standard drinks of alcohol    Types: 2 Glasses of wine per week   Drug use: No   Current Outpatient Medications  Medication Sig Dispense Refill   albuterol (PROVENTIL HFA;VENTOLIN HFA) 108 (90 Base) MCG/ACT inhaler Take 2 puffs by mouth every 6 hours as needed 8.5 Inhaler 0   fexofenadine (ALLEGRA) 180 MG tablet Take 180 mg by mouth as needed for allergies or rhinitis. Reported on 07/02/2015     lansoprazole (PREVACID) 30 MG capsule TAKE 1 CAPSULE BY MOUTH DAILY AT 12 NOON 30 capsule 0   No current facility-administered  medications for this visit.   No Known Allergies   Review of Systems: All systems reviewed and negative except where noted in HPI.    No results found.  Physical Exam: There were no vitals taken for this visit. Constitutional: Pleasant,well-developed, ***female in no acute distress. HEENT: Normocephalic and atraumatic. Conjunctivae are normal. No scleral icterus. Neck supple.  Cardiovascular: Normal rate, regular rhythm.  Pulmonary/chest: Effort normal and breath sounds normal. No wheezing, rales or rhonchi. Abdominal: Soft, nondistended, nontender. Bowel sounds active throughout. There are no masses palpable. No hepatomegaly. Extremities: no edema Lymphadenopathy: No cervical adenopathy noted. Neurological: Alert and oriented to person place and time. Skin: Skin is warm and dry. No rashes noted. Psychiatric: Normal mood and affect. Behavior is normal.   ASSESSMENT: 57 y.o. female here for assessment of the following  No diagnosis found.  PLAN:   [place], Betty G, MD

## 2022-12-15 ENCOUNTER — Encounter: Payer: Self-pay | Admitting: Gastroenterology

## 2022-12-15 ENCOUNTER — Ambulatory Visit (INDEPENDENT_AMBULATORY_CARE_PROVIDER_SITE_OTHER): Payer: 59 | Admitting: Gastroenterology

## 2022-12-15 VITALS — BP 118/68 | HR 70 | Ht 67.0 in | Wt 218.0 lb

## 2022-12-15 DIAGNOSIS — Z800 Family history of malignant neoplasm of digestive organs: Secondary | ICD-10-CM

## 2022-12-15 DIAGNOSIS — R1011 Right upper quadrant pain: Secondary | ICD-10-CM | POA: Diagnosis not present

## 2022-12-15 NOTE — Patient Instructions (Addendum)
If your blood pressure at your visit was 140/90 or greater, please contact your primary care physician to follow up on this. ______________________________________________________  If you are age 58 or older, your body mass index should be between 23-30. Your Body mass index is 34.14 kg/m. If this is out of the aforementioned range listed, please consider follow up with your Primary Care Provider.  If you are age 77 or younger, your body mass index should be between 19-25. Your Body mass index is 34.14 kg/m. If this is out of the aformentioned range listed, please consider follow up with your Primary Care Provider.  ________________________________________________________  The [facility] providers would like to encourage you to use [facility] to communicate with providers for non-urgent requests or questions.  Due to long hold times on the telephone, sending your provider a message by [facility] may be a faster and more efficient way to get a response.  Please allow 48 business hours for a response.  Please remember that this is for non-urgent requests.  _______________________________________________________  Due to recent changes in healthcare laws, you may see the results of your imaging and laboratory studies on MyChart before your provider has had a chance to review them.  We understand that in some cases there may be results that are confusing or concerning to you. Not all laboratory results come back in the same time frame and the provider may be waiting for multiple results in order to interpret others.  Please give [place] 48 hours in order for your provider to thoroughly review all the results before contacting the office for clarification of your results.   You have been scheduled for an MRI/MRCP at [facility], 1st floor, Radiology. Your appointment is scheduled on Saturday, 12-18-22 at 8:00 am. Please arrive 15 minutes prior to your appointment time for registration purposes. Please make  certain not to have anything to eat or drink 4 hours prior to your test. In addition, if you have any metal in your body, have a pacemaker or defibrillator, please be sure to let your ordering physician know. This test typically takes 45 minutes to 1 hour to complete. Should you need to reschedule, please call [phone].   We are referring you to Sports Medicine for abdominal/back pain.  They will contact you directly to schedule an appointment.  It may take a week or more before you hear from them.  Please feel free to contact us if you have not heard from them within 2 weeks and we will follow up on the referral.   Thank you for entrusting me with your care and for choosing [facility], Dr. Ileene Patrick

## 2022-12-18 ENCOUNTER — Ambulatory Visit (HOSPITAL_COMMUNITY): Payer: 59

## 2022-12-22 NOTE — Progress Notes (Unsigned)
[facility] [address] [place] [address] Phone: [phone] Subjective:   Bruce Donath, am serving as a scribe for Dr. Antoine Primas.  I'm seeing this patient by the request  of:  [place], Betty G, MD  CC: Upper back pain  [id number]  TRENITA HULME is a 57 y.o. female coming in with complaint of intermittent RUQ pain. Patient states that for past 7 years she has had intermittent pain in this area. Had increase in pain in 2022 and went through a lot of tests through gastroenterology. Maternal history of stomach cancer. Also started to see chiropractor which did provide her with some relief. Pain is in lower thoracic spine on R side that wraps around to the RUQ. Patient notes pain when she initially lies supine on hard floor. Pain will dissipate w/in 1 minute. Denies any numbness or tingling .    Patient has had extensive workup by other physicians over the years.  Most recently did see gastroenterology and did have a CT scan of the back as well as a HIDA scan that was unremarkable.  Past Medical History:  Diagnosis Date   Chronic RUQ pain    GERD (gastroesophageal reflux disease)    Sleep apnea    Past Surgical History:  Procedure Laterality Date   ABDOMINAL HYSTERECTOMY     FOOT SURGERY     Social History   Socioeconomic History   Marital status: Married    Spouse name: Not on file   Number of children: Not on file   Years of education: Not on file   Highest education level: Not on file  Occupational History   Not on file  Tobacco Use   Smoking status: Never   Smokeless tobacco: Never  Vaping Use   Vaping status: Never Used  Substance and Sexual Activity   Alcohol use: Yes    Alcohol/week: 2.0 standard drinks of alcohol    Types: 2 Glasses of wine per week   Drug use: No   Sexual activity: Not on file  Other Topics Concern   Not on file  Social History Narrative   Not on file   Social Determinants of Health    Financial Resource Strain: Not on file  Food Insecurity: Not on file  Transportation Needs: Not on file  Physical Activity: Not on file  Stress: Not on file  Social Connections: Unknown (08/04/2021)   [facility]   Social Network    Social Network: Not on file   No Known Allergies Family History  Problem Relation Age of Onset   Stomach cancer Mother        passed away with stomach cancer    Prostate cancer Father    Pancreatic cancer Brother    Colon cancer Neg Hx    Esophageal cancer Neg Hx    Rectal cancer Neg Hx       Current Outpatient Medications (Respiratory):    albuterol (PROVENTIL HFA;VENTOLIN HFA) 108 (90 Base) MCG/ACT inhaler, Take 2 puffs by mouth every 6 hours as needed   fexofenadine (ALLEGRA) 180 MG tablet, Take 180 mg by mouth as needed for allergies or rhinitis. Reported on 07/02/2015    Current Outpatient Medications (Other):    lansoprazole (PREVACID) 30 MG capsule, TAKE 1 CAPSULE BY MOUTH DAILY AT 12 NOON   Reviewed prior external information including notes and imaging from  primary care provider As well as notes that were available from care everywhere  and other healthcare systems.  Past medical history, social, surgical and family history all reviewed in electronic medical record.  No pertanent information unless stated regarding to the chief complaint.   Review of Systems:  No headache, visual changes, nausea, vomiting, diarrhea, constipation, dizziness, abdominal pain, skin rash, fevers, chills, night sweats, weight loss, swollen lymph nodes, body aches, joint swelling, chest pain, shortness of breath, mood changes. POSITIVE muscle aches  Objective  Blood pressure 124/80, pulse 84, height 5\' 7"  (1.702 m), weight 216 lb (98 kg), SpO2 96%.   General: No apparent distress alert and oriented x3 mood and affect normal, dressed appropriately.  HEENT: Pupils equal, extraocular movements intact  Respiratory: Patient's  speak in full sentences and does not appear short of breath  Cardiovascular: No lower extremity edema, non tender, no erythema  Patient actually has hypermobility noted.  Does have some tightness though noted of the hip flexor on the right side.  Patient is tender to palpation in the T11-L1 area on the right side.  No masses appreciated.  No CVA tenderness noted.  Does have some tightness with certain range of motion.  Osteopathic findings  T9 extended rotated and side bent right L1 flexed rotated and side bent right Sacrum right on right     Impression and Recommendations:    The above documentation has been reviewed and is accurate and complete Judi Saa, DO

## 2022-12-23 ENCOUNTER — Encounter: Payer: Self-pay | Admitting: Family Medicine

## 2022-12-23 ENCOUNTER — Ambulatory Visit: Payer: 59 | Admitting: Family Medicine

## 2022-12-23 ENCOUNTER — Ambulatory Visit: Payer: 59

## 2022-12-23 VITALS — BP 124/80 | HR 84 | Ht 67.0 in | Wt 216.0 lb

## 2022-12-23 DIAGNOSIS — M9903 Segmental and somatic dysfunction of lumbar region: Secondary | ICD-10-CM

## 2022-12-23 DIAGNOSIS — M24551 Contracture, right hip: Secondary | ICD-10-CM

## 2022-12-23 DIAGNOSIS — M9902 Segmental and somatic dysfunction of thoracic region: Secondary | ICD-10-CM

## 2022-12-23 DIAGNOSIS — M545 Low back pain, unspecified: Secondary | ICD-10-CM

## 2022-12-23 DIAGNOSIS — M9904 Segmental and somatic dysfunction of sacral region: Secondary | ICD-10-CM | POA: Diagnosis not present

## 2022-12-23 DIAGNOSIS — M546 Pain in thoracic spine: Secondary | ICD-10-CM

## 2022-12-23 NOTE — Patient Instructions (Addendum)
Xray today Exercises 3 IBU 3x a day for 3 days See me in 6 weeks

## 2022-12-23 NOTE — Assessment & Plan Note (Signed)

## 2022-12-23 NOTE — Assessment & Plan Note (Signed)
Discussed with patient icing regimen and home exercises.  Hold on any type of medications.  Responded extremely well well to osteopathic manipulation today.  Will work on core strength.  He may have some underlying hypermobility activity contributing as well that we will need to monitor.  Follow-up with me again in in 6 weeks

## 2023-02-02 NOTE — Progress Notes (Unsigned)
  [facility] [address] [place] [address] Phone: [phone] Subjective:   [facility], am serving as a scribe for Dr. Antoine Primas.  I'm seeing this patient by the request  of:  [place], Betty G, MD  CC: Back and neck pain follow-up  [id number]  Carmen Knight is a 57 y.o. female coming in with complaint of back and neck pain. OMT 12/23/2022. Patient states same per usual. No new concerns.  Medications patient has been prescribed: None          Reviewed prior external information including notes and imaging from previsou exam, outside providers and external EMR if available.   As well as notes that were available from care everywhere and other healthcare systems.  Past medical history, social, surgical and family history all reviewed in electronic medical record.  No pertanent information unless stated regarding to the chief complaint.   Past Medical History:  Diagnosis Date   Chronic RUQ pain    GERD (gastroesophageal reflux disease)    Sleep apnea     No Known Allergies   Review of Systems:  No headache, visual changes, nausea, vomiting, diarrhea, constipation, dizziness, abdominal pain, skin rash, fevers, chills, night sweats, weight loss, swollen lymph nodes, body aches, joint swelling, chest pain, shortness of breath, mood changes. POSITIVE muscle aches  Objective  Blood pressure 128/82, pulse 75, height 5\' 7"  (1.702 m), weight 220 lb (99.8 kg), SpO2 96%.   General: No apparent distress alert and oriented x3 mood and affect normal, dressed appropriately.  HEENT: Pupils equal, extraocular movements intact  Respiratory: Patient's speak in full sentences and does not appear short of breath  Cardiovascular: No lower extremity edema, non tender, no erythema  MSK:  Back does have some loss lordosis noted.  Some tenderness to palpation in the paraspinal musculature.  Tightness still noted in the right hip flexor compared to  the left side.  Osteopathic findings T9 extended rotated and side bent left L2 flexed rotated and side bent right L4 flexed rotated and side bent left Sacrum right on right    Assessment and Plan:  Hip flexor tightness, right Patient is making great strides at this time.  Discussed icing regimen and home exercises, discussed with patient to continue to work on core strengthening.  Discussed the ergonomics about setting.  Responding well to osteopathic manipulation.  Follow-up again in 6 to 8 weeks otherwise.    Nonallopathic problems  Decision today to treat with OMT was based on Physical Exam  After verbal consent patient was treated with HVLA, ME, FPR techniques in  thoracic, lumbar, and sacral  areas  Patient tolerated the procedure well with improvement in symptoms  Patient given exercises, stretches and lifestyle modifications  See medications in patient instructions if given  Patient will follow up in 4-8 weeks    The above documentation has been reviewed and is accurate and complete Judi Saa, DO          Note: This dictation was prepared with Dragon dictation along with smaller phrase technology. Any transcriptional errors that result from this process are unintentional.

## 2023-02-03 ENCOUNTER — Encounter: Payer: Self-pay | Admitting: Family Medicine

## 2023-02-03 ENCOUNTER — Ambulatory Visit: Payer: 59 | Admitting: Family Medicine

## 2023-02-03 VITALS — BP 128/82 | HR 75 | Ht 67.0 in | Wt 220.0 lb

## 2023-02-03 DIAGNOSIS — M9902 Segmental and somatic dysfunction of thoracic region: Secondary | ICD-10-CM

## 2023-02-03 DIAGNOSIS — M24551 Contracture, right hip: Secondary | ICD-10-CM

## 2023-02-03 DIAGNOSIS — M9904 Segmental and somatic dysfunction of sacral region: Secondary | ICD-10-CM

## 2023-02-03 DIAGNOSIS — M9903 Segmental and somatic dysfunction of lumbar region: Secondary | ICD-10-CM

## 2023-02-03 NOTE — Patient Instructions (Signed)
Good to see you! Significant improvement See you again in 2-3 months

## 2023-02-03 NOTE — Assessment & Plan Note (Signed)
Patient is making great strides at this time.  Discussed icing regimen and home exercises, discussed with patient to continue to work on core strengthening.  Discussed the ergonomics about setting.  Responding well to osteopathic manipulation.  Follow-up again in 6 to 8 weeks otherwise.

## 2023-09-21 ENCOUNTER — Other Ambulatory Visit: Payer: Self-pay | Admitting: Medical Genetics

## 2023-09-26 NOTE — Progress Notes (Signed)
 HPI: Carmen Knight is a 58 y.o. female with a PMHx significant for Obesity, Class II, BMI 35-39.9; OSA (obstructive sleep apnea); Hepatic steatosis; and Hyperlipidemia, who is here today for her routine physical.  Last CPE: 01/14/2020  Exercise: Diet:  Sleep:  Alcohol Use:  Smoking: Vision:  Dental:  Chronic medical problems:   Obstructive Sleep Apnea  GERD: Managed with Prevacid  30 mg daily.  Asthma: Managed with Albuterol  inhaler  Concerns today: ***  Immunization History  Administered Date(s) Administered   Influenza Inj Mdck Quad Pf 12/09/2021   Influenza,inj,Quad PF,6+ Mos 12/31/2016, 01/03/2019   Influenza-Unspecified 12/09/2021   PFIZER(Purple Top)SARS-COV-2 Vaccination 06/04/2019, 06/26/2019, 02/16/2020, 07/26/2020   Pfizer Covid-19 Vaccine Bivalent Booster 57yrs & up 12/09/2021   Zoster Recombinant(Shingrix) 04/10/2020, 10/01/2020   Health Maintenance  Topic Date Due   DTaP/Tdap/Td (1 - Tdap) Never done   Hepatitis B Vaccines (1 of 3 - 19+ 3-dose series) Never done   MAMMOGRAM  12/09/2021   COVID-19 Vaccine (6 - 2024-25 season) 11/21/2022   INFLUENZA VACCINE  10/21/2023   Cervical Cancer Screening (HPV/Pap Cotest)  11/10/2025   Colonoscopy  02/17/2026   Hepatitis C Screening  Completed   HIV Screening  Completed   Zoster Vaccines- Shingrix  Completed   HPV VACCINES  Aged Out   Meningococcal B Vaccine  Aged Out   Review of Systems  Current Outpatient Medications on File Prior to Visit  Medication Sig Dispense Refill   albuterol  (PROVENTIL  HFA;VENTOLIN  HFA) 108 (90 Base) MCG/ACT inhaler Take 2 puffs by mouth every 6 hours as needed 8.5 Inhaler 0   fexofenadine (ALLEGRA) 180 MG tablet Take 180 mg by mouth as needed for allergies or rhinitis. Reported on 07/02/2015     lansoprazole  (PREVACID ) 30 MG capsule TAKE 1 CAPSULE BY MOUTH DAILY AT 12 NOON 30 capsule 0   No current facility-administered medications on file prior to visit.    Past Medical  History:  Diagnosis Date   Chronic RUQ pain    GERD (gastroesophageal reflux disease)    Sleep apnea     Past Surgical History:  Procedure Laterality Date   ABDOMINAL HYSTERECTOMY     FOOT SURGERY      No Known Allergies  Family History  Problem Relation Age of Onset   Stomach cancer Mother        passed away with stomach cancer    Prostate cancer Father    Pancreatic cancer Brother    Colon cancer Neg Hx    Esophageal cancer Neg Hx    Rectal cancer Neg Hx     Social History   Socioeconomic History   Marital status: Married    Spouse name: Not on file   Number of children: Not on file   Years of education: Not on file   Highest education level: Not on file  Occupational History   Not on file  Tobacco Use   Smoking status: Never   Smokeless tobacco: Never  Vaping Use   Vaping status: Never Used  Substance and Sexual Activity   Alcohol use: Yes    Alcohol/week: 2.0 standard drinks of alcohol    Types: 2 Glasses of wine per week   Drug use: No   Sexual activity: Not on file  Other Topics Concern   Not on file  Social History Narrative   Not on file   Social Drivers of Health   Financial Resource Strain: Not on file  Food Insecurity: Not on file  Transportation  Needs: Not on file  Physical Activity: Not on file  Stress: Not on file  Social Connections: Unknown (08/04/2021)   [facility] from [facility]   Social Network    Social Network: Not on file    There were no vitals filed for this visit. There is no height or weight on file to calculate BMI.  Wt Readings from Last 3 Encounters:  02/03/23 220 lb (99.8 kg)  12/23/22 216 lb (98 kg)  12/15/22 218 lb (98.9 kg)    Physical Exam  ASSESSMENT AND PLAN: Ms. GALE KLAR was here today annual physical examination.  No orders of the defined types were placed in this encounter.  There are no diagnoses linked to this encounter.  No follow-ups on file. I,Emily Lagle,acting as a Neurosurgeon for Omari Mcmanaway  [place], MD.,have documented all relevant documentation on the behalf of Carmen Calderone [place], MD,as directed by  Carmen Simone [place], MD while in the presence of Carmen Thome [place], MD.  *** (refresh reminder)  I, Carmen Sellitto [place], MD, have reviewed all documentation for this visit. The documentation on 09/26/23 for the exam, diagnosis, procedures, and orders are all accurate and complete. Carmen Hawes G. [place], MD  [facility]. [place] office.

## 2023-09-28 ENCOUNTER — Encounter: Payer: Self-pay | Admitting: Family Medicine

## 2023-09-28 ENCOUNTER — Ambulatory Visit: Admitting: Family Medicine

## 2023-09-28 ENCOUNTER — Ambulatory Visit: Payer: Self-pay | Admitting: Family Medicine

## 2023-09-28 ENCOUNTER — Other Ambulatory Visit (HOSPITAL_COMMUNITY)
Admission: RE | Admit: 2023-09-28 | Discharge: 2023-09-28 | Disposition: A | Discharge status: Home / Self Care | Payer: Self-pay | Source: Ambulatory Visit | Attending: Medical Genetics | Admitting: Medical Genetics

## 2023-09-28 VITALS — BP 128/80 | HR 70 | Temp 98.4°F | Resp 16 | Ht 67.0 in | Wt 224.0 lb

## 2023-09-28 DIAGNOSIS — Z13228 Encounter for screening for other metabolic disorders: Secondary | ICD-10-CM | POA: Diagnosis not present

## 2023-09-28 DIAGNOSIS — Z130 Encounter for screening for diseases of the blood and blood-forming organs and certain disorders involving the immune mechanism: Secondary | ICD-10-CM

## 2023-09-28 DIAGNOSIS — E785 Hyperlipidemia, unspecified: Secondary | ICD-10-CM | POA: Diagnosis not present

## 2023-09-28 DIAGNOSIS — Z23 Encounter for immunization: Secondary | ICD-10-CM | POA: Diagnosis not present

## 2023-09-28 DIAGNOSIS — Z1329 Encounter for screening for other suspected endocrine disorder: Secondary | ICD-10-CM | POA: Diagnosis not present

## 2023-09-28 DIAGNOSIS — Z0000 Encounter for general adult medical examination without abnormal findings: Secondary | ICD-10-CM | POA: Diagnosis not present

## 2023-09-28 LAB — COMPREHENSIVE METABOLIC PANEL WITH GFR
ALT: 13 U/L (ref 0–35)
AST: 16 U/L (ref 0–37)
Albumin: 4.3 g/dL (ref 3.5–5.2)
Alkaline Phosphatase: 58 U/L (ref 39–117)
BUN: 17 mg/dL (ref 6–23)
CO2: 30 meq/L (ref 19–32)
Calcium: 9.1 mg/dL (ref 8.4–10.5)
Chloride: 104 meq/L (ref 96–112)
Creatinine, Ser: 0.76 mg/dL (ref 0.40–1.20)
GFR: 86.11 mL/min (ref >60.00)
Glucose, Bld: 95 mg/dL (ref 70–99)
Potassium: 4.4 meq/L (ref 3.5–5.1)
Sodium: 138 meq/L (ref 135–145)
Total Bilirubin: 0.6 mg/dL (ref 0.2–1.2)
Total Protein: 6.7 g/dL (ref 6.0–8.3)

## 2023-09-28 LAB — LIPID PANEL
Cholesterol: 183 mg/dL (ref 0–200)
HDL: 42.8 mg/dL (ref >39.00)
LDL Cholesterol: 110 mg/dL — ABNORMAL HIGH (ref 0–99)
NonHDL: 139.8
Total CHOL/HDL Ratio: 4
Triglycerides: 150 mg/dL — ABNORMAL HIGH (ref 0.0–149.0)
VLDL: 30 mg/dL (ref 0.0–40.0)

## 2023-09-28 LAB — HEMOGLOBIN A1C: Hgb A1c MFr Bld: 5.9 % (ref 4.6–6.5)

## 2023-09-28 NOTE — Assessment & Plan Note (Signed)
 We discussed the importance of regular physical activity and healthy diet for prevention of chronic illness and/or complications. Preventive guidelines reviewed. Vaccination updated. Continue her female preventive care with gynecologist. Ca++ and vit D supplementation recommended. Next CPE in a year.

## 2023-09-28 NOTE — Patient Instructions (Addendum)
 A few things to remember from today's visit:  Routine general medical examination at a health care facility  Screening for endocrine, metabolic and immunity disorder - Plan: Hemoglobin A1c  Hyperlipidemia, unspecified hyperlipidemia type - Plan: Comprehensive metabolic panel with GFR, Lipid panel  Pneumonia and tetanus vaccines given today.  Do not use My Chart to request refills or for acute issues that need immediate attention. If you send a my chart message, it may take a few days to be addressed, specially if I am not in the office.  Please be sure medication list is accurate. If a new problem present, please set up appointment sooner than planned today.  Health Maintenance, Female Adopting a healthy lifestyle and getting preventive care are important in promoting health and wellness. Ask your health care provider about: The right schedule for you to have regular tests and exams. Things you can do on your own to prevent diseases and keep yourself healthy. What should I know about diet, weight, and exercise? Eat a healthy diet  Eat a diet that includes plenty of vegetables, fruits, low-fat dairy products, and lean protein. Do not eat a lot of foods that are high in solid fats, added sugars, or sodium. Maintain a healthy weight Body mass index (BMI) is used to identify weight problems. It estimates body fat based on height and weight. Your health care provider can help determine your BMI and help you achieve or maintain a healthy weight. Get regular exercise Get regular exercise. This is one of the most important things you can do for your health. Most adults should: Exercise for at least 150 minutes each week. The exercise should increase your heart rate and make you sweat (moderate-intensity exercise). Do strengthening exercises at least twice a week. This is in addition to the moderate-intensity exercise. Spend less time sitting. Even light physical activity can be  beneficial. Watch cholesterol and blood lipids Have your blood tested for lipids and cholesterol at 59 years of age, then have this test every 5 years. Have your cholesterol levels checked more often if: Your lipid or cholesterol levels are high. You are older than 59 years of age. You are at high risk for heart disease. What should I know about cancer screening? Depending on your health history and family history, you may need to have cancer screening at various ages. This may include screening for: Breast cancer. Cervical cancer. Colorectal cancer. Skin cancer. Lung cancer. What should I know about heart disease, diabetes, and high blood pressure? Blood pressure and heart disease High blood pressure causes heart disease and increases the risk of stroke. This is more likely to develop in people who have high blood pressure readings or are overweight. Have your blood pressure checked: Every 3-5 years if you are 59-38 years of age. Every year if you are 59 years old or older. Diabetes Have regular diabetes screenings. This checks your fasting blood sugar level. Have the screening done: Once every three years after age 4 if you are at a normal weight and have a low risk for diabetes. More often and at a younger age if you are overweight or have a high risk for diabetes. What should I know about preventing infection? Hepatitis B If you have a higher risk for hepatitis B, you should be screened for this virus. Talk with your health care provider to find out if you are at risk for hepatitis B infection. Hepatitis C Testing is recommended for: Everyone born from [birth year] through 1966. Anyone  with known risk factors for hepatitis C. Sexually transmitted infections (STIs) Get screened for STIs, including gonorrhea and chlamydia, if: You are sexually active and are younger than 59 years of age You are older than 59 years of age and your health care provider tells you that you are at risk for  this type of infection. Your sexual activity has changed since you were last screened, and you are at increased risk for chlamydia or gonorrhea. Ask your health care provider if you are at risk. Ask your health care provider about whether you are at high risk for HIV. Your health care provider may recommend a prescription medicine to help prevent HIV infection. If you choose to take medicine to prevent HIV, you should first get tested for HIV. You should then be tested every 3 months for as long as you are taking the medicine. Pregnancy If you are about to stop having your period (premenopausal) and you may become pregnant, seek counseling before you get pregnant. Take 400 to 800 micrograms (mcg) of folic acid every day if you become pregnant. Ask for birth control (contraception) if you want to prevent pregnancy. Osteoporosis and menopause Osteoporosis is a disease in which the bones lose minerals and strength with aging. This can result in bone fractures. If you are 59 years old or older, or if you are at risk for osteoporosis and fractures, ask your health care provider if you should: Be screened for bone loss. Take a calcium or vitamin D supplement to lower your risk of fractures. Be given hormone replacement therapy (HRT) to treat symptoms of menopause. Follow these instructions at home: Alcohol use Do not drink alcohol if: Your health care provider tells you not to drink. You are pregnant, may be pregnant, or are planning to become pregnant. If you drink alcohol: Limit how much you have to: 0-1 drink a day. Know how much alcohol is in your drink. In the [place] one drink equals one 12 oz bottle of beer (355 mL), one 5 oz glass of wine (148 mL), or one 1 oz glass of hard liquor (44 mL). Lifestyle Do not use any products that contain nicotine or tobacco. These products include cigarettes, chewing tobacco, and vaping devices, such as e-cigarettes. If you need help quitting, ask your health  care provider. Do not use street drugs. Do not share needles. Ask your health care provider for help if you need support or information about quitting drugs. General instructions Schedule regular health, dental, and eye exams. Stay current with your vaccines. Tell your health care provider if: You often feel depressed. You have ever been abused or do not feel safe at home. Summary Adopting a healthy lifestyle and getting preventive care are important in promoting health and wellness. Follow your health care provider's instructions about healthy diet, exercising, and getting tested or screened for diseases. Follow your health care provider's instructions on monitoring your cholesterol and blood pressure. This information is not intended to replace advice given to you by your health care provider. Make sure you discuss any questions you have with your health care provider. Document Revised: 07/28/2020 Document Reviewed: 07/28/2020 Elsevier Patient Education  2024 [employer].

## 2023-09-28 NOTE — Assessment & Plan Note (Signed)
 Continue nonpharmacologic treatment. Further recommendations will be given according to 10 years CVD risk score and lipid panel numbers.

## 2023-10-05 ENCOUNTER — Ambulatory Visit: Admitting: Family Medicine

## 2023-10-05 NOTE — Progress Notes (Incomplete)
   ACUTE VISIT No chief complaint on file.  HPI: Carmen Knight is a 58 y.o. female with a PMHx significant for OSA (obstructive sleep apnea); hepatic steatosis; and hyperlipidemia, who is here today complaining of ***.   Review of Systems See other pertinent positives and negatives in HPI.  Current Outpatient Medications on File Prior to Visit  Medication Sig Dispense Refill   fexofenadine (ALLEGRA) 180 MG tablet Take 180 mg by mouth as needed for allergies or rhinitis. Reported on 07/02/2015     No current facility-administered medications on file prior to visit.    Past Medical History:  Diagnosis Date   Chronic RUQ pain    GERD (gastroesophageal reflux disease)    Sleep apnea    No Known Allergies  Social History   Socioeconomic History   Marital status: Married    Spouse name: Not on file   Number of children: Not on file   Years of education: Not on file   Highest education level: Not on file  Occupational History   Not on file  Tobacco Use   Smoking status: Never   Smokeless tobacco: Never  Vaping Use   Vaping status: Never Used  Substance and Sexual Activity   Alcohol use: Yes    Alcohol/week: 2.0 standard drinks of alcohol    Types: 2 Glasses of wine per week   Drug use: No   Sexual activity: Not on file  Other Topics Concern   Not on file  Social History Narrative   Not on file   Social Drivers of Health   Financial Resource Strain: Not on file  Food Insecurity: Not on file  Transportation Needs: Not on file  Physical Activity: Not on file  Stress: Not on file  Social Connections: Unknown (08/04/2021)   Received from [facility]   Social Network    Social Network: Not on file    There were no vitals filed for this visit. There is no height or weight on file to calculate BMI.  Physical Exam  ASSESSMENT AND PLAN: Carmen Knight was seen here today for ***.  There are no diagnoses linked to this encounter.  No follow-ups on  file. I,Emily Lagle,acting as a Neurosurgeon for Betty [place], MD.,have documented all relevant documentation on the behalf of Betty [place], MD,as directed by  Betty [place], MD while in the presence of Betty [place], MD.  *** (refresh reminder)  I, Betty [place], MD, have reviewed all documentation for this visit. The documentation on 10/05/23 for the exam, diagnosis, procedures, and orders are all accurate and complete. Betty G. [place], MD  [facility] [place] office.  Discharge Instructions   None

## 2023-10-08 LAB — GENECONNECT MOLECULAR SCREEN: Genetic Analysis Overall Interpretation: NEGATIVE

## 2023-10-10 ENCOUNTER — Encounter: Payer: Self-pay | Admitting: Family Medicine

## 2023-10-10 DIAGNOSIS — Z1283 Encounter for screening for malignant neoplasm of skin: Secondary | ICD-10-CM

## 2024-03-28 ENCOUNTER — Encounter: Payer: Self-pay | Admitting: Dermatology

## 2024-03-28 ENCOUNTER — Ambulatory Visit: Admitting: Dermatology

## 2024-03-28 DIAGNOSIS — L821 Other seborrheic keratosis: Secondary | ICD-10-CM

## 2024-03-28 DIAGNOSIS — D1801 Hemangioma of skin and subcutaneous tissue: Secondary | ICD-10-CM | POA: Diagnosis not present

## 2024-03-28 DIAGNOSIS — Z1283 Encounter for screening for malignant neoplasm of skin: Secondary | ICD-10-CM | POA: Diagnosis not present

## 2024-03-28 DIAGNOSIS — L578 Other skin changes due to chronic exposure to nonionizing radiation: Secondary | ICD-10-CM

## 2024-03-28 DIAGNOSIS — L814 Other melanin hyperpigmentation: Secondary | ICD-10-CM

## 2024-03-28 DIAGNOSIS — W908XXA Exposure to other nonionizing radiation, initial encounter: Secondary | ICD-10-CM | POA: Diagnosis not present

## 2024-03-28 DIAGNOSIS — D229 Melanocytic nevi, unspecified: Secondary | ICD-10-CM

## 2024-03-28 NOTE — Patient Instructions (Signed)

## 2024-03-28 NOTE — Progress Notes (Signed)
" ° °  New Patient Visit   Patient (and/or pt guardian) consented to the use of AI-assisted tools for note generation.  Subjective  Carmen Knight is a 59 y.o. female who presents for the following:  Total Body Skin Exam (TBSE)  Patient present today for new patient visit for TBSE. The patient reports she has spots, moles and lesions to be evaluated on forehead, between breasts back and left hip Some may be new or changing and the patient may have concern these could be cancer.  Patient has previously been treated by a dermatologist 10 years ago  Patient reports she has hx of bx with benign diagnosis  Patient denies family history of skin cancers. Patient reports throughout her lifetime has had complete sun exposure (grew up in [place] ) Currently, patient reports if she has excessive sun exposure, she does apply sunscreen and/or wears protective coverings.  The following portions of the chart were reviewed this encounter and updated as appropriate: medications, allergies, medical history  Review of Systems:  No other skin or systemic complaints except as noted in HPI or Assessment and Plan.  Objective  Well appearing patient in no apparent distress; mood and affect are within normal limits.  A full examination was performed including scalp, head, eyes, ears, nose, lips, neck, chest, axillae, abdomen, back, buttocks, bilateral upper extremities, bilateral lower extremities, hands, feet, fingers, toes, fingernails, and toenails. All findings within normal limits unless otherwise noted below.   Relevant exam findings are noted in the Assessment and Plan.   Assessment & Plan   LENTIGINES, SEBORRHEIC KERATOSES, HEMANGIOMAS - Benign normal skin lesions - Benign-appearing - Call for any changes  MELANOCYTIC NEVI - Tan-brown and/or pink-flesh-colored symmetric macules and papules - Benign appearing on exam today - Observation - Call clinic for new or changing moles - Recommend daily use  of broad spectrum spf 30+ sunscreen to sun-exposed areas.   ACTINIC DAMAGE - Chronic condition, secondary to cumulative UV/sun exposure - diffuse scaly erythematous macules with underlying dyspigmentation - Recommend daily broad spectrum sunscreen SPF 30+ to sun-exposed areas, reapply every 2 hours as needed.  - Staying in the shade or wearing long sleeves, sun glasses (UVA+UVB protection) and wide brim hats (4-inch brim around the entire circumference of the hat) are also recommended for sun protection.  - Call for new or changing lesions.  SKIN CANCER SCREENING PERFORMED TODAY   Return in 1 year (on 03/28/2025) for TBSE F/U.  [name], am acting as a neurosurgeon for [employer], DO .  Documentation: I have reviewed the above documentation for accuracy and completeness, and I agree with the above.  Delon Lenis, DO      "
